# Patient Record
Sex: Female | Born: 1945 | Race: White | Hispanic: No | Marital: Married | Smoking: Never smoker
Health system: Southern US, Community
[De-identification: ages and names within clinical notes are randomized; demographics above are authoritative.]

## PROBLEM LIST (undated history)

## (undated) DIAGNOSIS — L509 Urticaria, unspecified: Secondary | ICD-10-CM

## (undated) DIAGNOSIS — O2240 Hemorrhoids in pregnancy, unspecified trimester: Secondary | ICD-10-CM

## (undated) DIAGNOSIS — D649 Anemia, unspecified: Secondary | ICD-10-CM

## (undated) HISTORY — PX: DILATION AND CURETTAGE OF UTERUS: SHX78

## (undated) HISTORY — PX: COLONOSCOPY: SHX174

---

## 2007-07-11 ENCOUNTER — Ambulatory Visit: Payer: Self-pay | Admitting: Gynecology

## 2007-07-11 ENCOUNTER — Encounter: Payer: Self-pay | Admitting: Obstetrics & Gynecology

## 2007-12-09 ENCOUNTER — Ambulatory Visit: Payer: Self-pay | Admitting: Gastroenterology

## 2009-01-01 ENCOUNTER — Ambulatory Visit: Payer: Self-pay | Admitting: Family Medicine

## 2010-01-02 ENCOUNTER — Ambulatory Visit: Payer: Self-pay | Admitting: Family Medicine

## 2011-01-28 ENCOUNTER — Ambulatory Visit: Payer: Self-pay | Admitting: Family Medicine

## 2011-07-08 ENCOUNTER — Emergency Department: Payer: Self-pay | Admitting: Internal Medicine

## 2012-02-09 ENCOUNTER — Ambulatory Visit: Payer: Self-pay | Admitting: Family Medicine

## 2013-03-15 ENCOUNTER — Ambulatory Visit: Payer: Self-pay | Admitting: Family Medicine

## 2014-03-02 ENCOUNTER — Emergency Department: Payer: Self-pay | Admitting: Emergency Medicine

## 2014-03-02 LAB — COMPREHENSIVE METABOLIC PANEL
ALT: 28 U/L (ref 12–78)
AST: 27 U/L (ref 15–37)
Albumin: 4 g/dL (ref 3.4–5.0)
Alkaline Phosphatase: 77 U/L
Anion Gap: 11 (ref 7–16)
BILIRUBIN TOTAL: 1.5 mg/dL — AB (ref 0.2–1.0)
BUN: 14 mg/dL (ref 7–18)
CO2: 22 mmol/L (ref 21–32)
CREATININE: 0.68 mg/dL (ref 0.60–1.30)
Calcium, Total: 9.4 mg/dL (ref 8.5–10.1)
Chloride: 109 mmol/L — ABNORMAL HIGH (ref 98–107)
EGFR (African American): >60
EGFR (Non-African Amer.): >60
GLUCOSE: 103 mg/dL — AB (ref 65–99)
OSMOLALITY: 284 (ref 275–301)
POTASSIUM: 3.9 mmol/L (ref 3.5–5.1)
Sodium: 142 mmol/L (ref 136–145)
Total Protein: 7.7 g/dL (ref 6.4–8.2)

## 2014-03-02 LAB — URINALYSIS, COMPLETE
Bacteria: NONE SEEN
Bilirubin,UR: NEGATIVE
Blood: NEGATIVE
GLUCOSE, UR: NEGATIVE mg/dL (ref 0–75)
Leukocyte Esterase: NEGATIVE
NITRITE: NEGATIVE
Ph: 5 (ref 4.5–8.0)
Protein: 30
RBC,UR: 1 /HPF (ref 0–5)
Specific Gravity: 1.025 (ref 1.003–1.030)
Squamous Epithelial: <1

## 2014-03-02 LAB — CBC WITH DIFFERENTIAL/PLATELET
BASOS PCT: 0.2 %
Basophil #: 0 10*3/uL (ref 0.0–0.1)
EOS ABS: 0 10*3/uL (ref 0.0–0.7)
Eosinophil %: 0.5 %
HCT: 44.9 % (ref 35.0–47.0)
HGB: 14.8 g/dL (ref 12.0–16.0)
LYMPHS PCT: 6.5 %
Lymphocyte #: 0.5 10*3/uL — ABNORMAL LOW (ref 1.0–3.6)
MCH: 30.3 pg (ref 26.0–34.0)
MCHC: 33 g/dL (ref 32.0–36.0)
MCV: 92 fL (ref 80–100)
MONO ABS: 0.5 x10 3/mm (ref 0.2–0.9)
MONOS PCT: 6.6 %
NEUTROS ABS: 6.5 10*3/uL (ref 1.4–6.5)
NEUTROS PCT: 86.2 %
PLATELETS: 170 10*3/uL (ref 150–440)
RBC: 4.89 10*6/uL (ref 3.80–5.20)
RDW: 12.9 % (ref 11.5–14.5)
WBC: 7.6 10*3/uL (ref 3.6–11.0)

## 2014-03-02 LAB — LIPASE, BLOOD: Lipase: 119 U/L (ref 73–393)

## 2014-03-02 LAB — TROPONIN I

## 2014-03-02 LAB — MAGNESIUM: Magnesium: 2 mg/dL

## 2014-04-06 ENCOUNTER — Ambulatory Visit: Payer: Self-pay | Admitting: Family Medicine

## 2015-10-16 ENCOUNTER — Other Ambulatory Visit: Payer: Self-pay | Admitting: Family Medicine

## 2015-10-16 DIAGNOSIS — Z1231 Encounter for screening mammogram for malignant neoplasm of breast: Secondary | ICD-10-CM

## 2015-10-31 ENCOUNTER — Ambulatory Visit
Admission: RE | Admit: 2015-10-31 | Discharge: 2015-10-31 | Disposition: A | Discharge status: Home / Self Care | Payer: Commercial Managed Care - HMO | Source: Ambulatory Visit | Attending: Family Medicine | Admitting: Family Medicine

## 2015-10-31 ENCOUNTER — Other Ambulatory Visit: Payer: Self-pay | Admitting: Family Medicine

## 2015-10-31 DIAGNOSIS — Z1231 Encounter for screening mammogram for malignant neoplasm of breast: Secondary | ICD-10-CM | POA: Diagnosis not present

## 2015-11-28 DIAGNOSIS — M545 Low back pain: Secondary | ICD-10-CM | POA: Diagnosis not present

## 2015-11-28 DIAGNOSIS — M47816 Spondylosis without myelopathy or radiculopathy, lumbar region: Secondary | ICD-10-CM | POA: Diagnosis not present

## 2015-12-03 ENCOUNTER — Other Ambulatory Visit: Payer: Self-pay | Admitting: Orthopedic Surgery

## 2015-12-03 DIAGNOSIS — M47816 Spondylosis without myelopathy or radiculopathy, lumbar region: Secondary | ICD-10-CM

## 2015-12-19 ENCOUNTER — Ambulatory Visit: Admission: RE | Admit: 2015-12-19 | Payer: PPO | Source: Ambulatory Visit

## 2015-12-25 DIAGNOSIS — M810 Age-related osteoporosis without current pathological fracture: Secondary | ICD-10-CM | POA: Diagnosis not present

## 2015-12-26 DIAGNOSIS — M25542 Pain in joints of left hand: Secondary | ICD-10-CM | POA: Diagnosis not present

## 2015-12-26 DIAGNOSIS — M720 Palmar fascial fibromatosis [Dupuytren]: Secondary | ICD-10-CM | POA: Diagnosis not present

## 2016-02-13 ENCOUNTER — Ambulatory Visit
Admission: RE | Admit: 2016-02-13 | Discharge: 2016-02-13 | Disposition: A | Discharge status: Home / Self Care | Payer: PPO | Source: Ambulatory Visit | Attending: Orthopedic Surgery | Admitting: Orthopedic Surgery

## 2016-02-13 DIAGNOSIS — M5126 Other intervertebral disc displacement, lumbar region: Secondary | ICD-10-CM | POA: Insufficient documentation

## 2016-02-13 DIAGNOSIS — M545 Low back pain: Secondary | ICD-10-CM | POA: Diagnosis not present

## 2016-02-13 DIAGNOSIS — M47816 Spondylosis without myelopathy or radiculopathy, lumbar region: Secondary | ICD-10-CM

## 2016-02-13 DIAGNOSIS — S3992XA Unspecified injury of lower back, initial encounter: Secondary | ICD-10-CM | POA: Diagnosis not present

## 2016-03-02 DIAGNOSIS — H2513 Age-related nuclear cataract, bilateral: Secondary | ICD-10-CM | POA: Diagnosis not present

## 2016-03-10 DIAGNOSIS — M5136 Other intervertebral disc degeneration, lumbar region: Secondary | ICD-10-CM | POA: Diagnosis not present

## 2016-03-10 DIAGNOSIS — M461 Sacroiliitis, not elsewhere classified: Secondary | ICD-10-CM | POA: Diagnosis not present

## 2016-03-10 DIAGNOSIS — M5416 Radiculopathy, lumbar region: Secondary | ICD-10-CM | POA: Diagnosis not present

## 2016-07-07 DIAGNOSIS — M76822 Posterior tibial tendinitis, left leg: Secondary | ICD-10-CM | POA: Diagnosis not present

## 2016-07-07 DIAGNOSIS — M25572 Pain in left ankle and joints of left foot: Secondary | ICD-10-CM | POA: Diagnosis not present

## 2016-07-07 DIAGNOSIS — M722 Plantar fascial fibromatosis: Secondary | ICD-10-CM | POA: Diagnosis not present

## 2016-07-28 DIAGNOSIS — M722 Plantar fascial fibromatosis: Secondary | ICD-10-CM | POA: Diagnosis not present

## 2016-07-28 DIAGNOSIS — M76822 Posterior tibial tendinitis, left leg: Secondary | ICD-10-CM | POA: Diagnosis not present

## 2016-11-04 DIAGNOSIS — Z0000 Encounter for general adult medical examination without abnormal findings: Secondary | ICD-10-CM | POA: Diagnosis not present

## 2016-11-11 DIAGNOSIS — M810 Age-related osteoporosis without current pathological fracture: Secondary | ICD-10-CM | POA: Diagnosis not present

## 2016-11-11 DIAGNOSIS — E785 Hyperlipidemia, unspecified: Secondary | ICD-10-CM | POA: Diagnosis not present

## 2016-11-11 DIAGNOSIS — Z0000 Encounter for general adult medical examination without abnormal findings: Secondary | ICD-10-CM | POA: Diagnosis not present

## 2016-11-25 ENCOUNTER — Other Ambulatory Visit: Payer: Self-pay | Admitting: Family Medicine

## 2016-11-25 DIAGNOSIS — Z1231 Encounter for screening mammogram for malignant neoplasm of breast: Secondary | ICD-10-CM

## 2016-12-22 ENCOUNTER — Ambulatory Visit
Admission: RE | Admit: 2016-12-22 | Discharge: 2016-12-22 | Disposition: A | Discharge status: Home / Self Care | Payer: PPO | Source: Ambulatory Visit | Attending: Family Medicine | Admitting: Family Medicine

## 2016-12-22 DIAGNOSIS — Z1231 Encounter for screening mammogram for malignant neoplasm of breast: Secondary | ICD-10-CM | POA: Insufficient documentation

## 2017-02-01 DIAGNOSIS — M791 Myalgia: Secondary | ICD-10-CM | POA: Diagnosis not present

## 2017-02-01 DIAGNOSIS — R509 Fever, unspecified: Secondary | ICD-10-CM | POA: Diagnosis not present

## 2017-02-01 DIAGNOSIS — J101 Influenza due to other identified influenza virus with other respiratory manifestations: Secondary | ICD-10-CM | POA: Diagnosis not present

## 2017-04-20 DIAGNOSIS — D692 Other nonthrombocytopenic purpura: Secondary | ICD-10-CM | POA: Diagnosis not present

## 2017-04-20 DIAGNOSIS — L570 Actinic keratosis: Secondary | ICD-10-CM | POA: Diagnosis not present

## 2017-04-20 DIAGNOSIS — L578 Other skin changes due to chronic exposure to nonionizing radiation: Secondary | ICD-10-CM | POA: Diagnosis not present

## 2017-04-20 DIAGNOSIS — L739 Follicular disorder, unspecified: Secondary | ICD-10-CM | POA: Diagnosis not present

## 2017-05-20 DIAGNOSIS — M79671 Pain in right foot: Secondary | ICD-10-CM | POA: Diagnosis not present

## 2017-05-20 DIAGNOSIS — M722 Plantar fascial fibromatosis: Secondary | ICD-10-CM | POA: Diagnosis not present

## 2017-08-10 DIAGNOSIS — M722 Plantar fascial fibromatosis: Secondary | ICD-10-CM | POA: Diagnosis not present

## 2017-11-09 DIAGNOSIS — Z0000 Encounter for general adult medical examination without abnormal findings: Secondary | ICD-10-CM | POA: Diagnosis not present

## 2017-11-16 DIAGNOSIS — Z0000 Encounter for general adult medical examination without abnormal findings: Secondary | ICD-10-CM | POA: Diagnosis not present

## 2017-11-16 DIAGNOSIS — Z1211 Encounter for screening for malignant neoplasm of colon: Secondary | ICD-10-CM | POA: Diagnosis not present

## 2017-11-30 ENCOUNTER — Other Ambulatory Visit: Payer: Self-pay | Admitting: Family Medicine

## 2017-11-30 DIAGNOSIS — Z1231 Encounter for screening mammogram for malignant neoplasm of breast: Secondary | ICD-10-CM

## 2017-12-09 DIAGNOSIS — H25813 Combined forms of age-related cataract, bilateral: Secondary | ICD-10-CM | POA: Diagnosis not present

## 2017-12-24 ENCOUNTER — Ambulatory Visit
Admission: RE | Admit: 2017-12-24 | Discharge: 2017-12-24 | Disposition: A | Discharge status: Home / Self Care | Payer: PPO | Source: Ambulatory Visit | Attending: Family Medicine | Admitting: Family Medicine

## 2017-12-24 DIAGNOSIS — Z1231 Encounter for screening mammogram for malignant neoplasm of breast: Secondary | ICD-10-CM

## 2018-01-10 DIAGNOSIS — Z1211 Encounter for screening for malignant neoplasm of colon: Secondary | ICD-10-CM | POA: Diagnosis not present

## 2018-02-10 DIAGNOSIS — N898 Other specified noninflammatory disorders of vagina: Secondary | ICD-10-CM | POA: Diagnosis not present

## 2018-02-10 DIAGNOSIS — R300 Dysuria: Secondary | ICD-10-CM | POA: Diagnosis not present

## 2018-02-10 DIAGNOSIS — N390 Urinary tract infection, site not specified: Secondary | ICD-10-CM | POA: Diagnosis not present

## 2018-03-01 DIAGNOSIS — M722 Plantar fascial fibromatosis: Secondary | ICD-10-CM | POA: Diagnosis not present

## 2018-03-04 DIAGNOSIS — N390 Urinary tract infection, site not specified: Secondary | ICD-10-CM | POA: Diagnosis not present

## 2018-03-04 DIAGNOSIS — R300 Dysuria: Secondary | ICD-10-CM | POA: Diagnosis not present

## 2018-03-29 ENCOUNTER — Encounter: Payer: Self-pay | Admitting: *Deleted

## 2018-03-30 ENCOUNTER — Encounter: Payer: Self-pay | Admitting: *Deleted

## 2018-03-30 ENCOUNTER — Other Ambulatory Visit: Payer: Self-pay

## 2018-03-30 ENCOUNTER — Ambulatory Visit: Payer: PPO | Admitting: Anesthesiology

## 2018-03-30 ENCOUNTER — Ambulatory Visit
Admission: RE | Admit: 2018-03-30 | Discharge: 2018-03-30 | Disposition: A | Discharge status: Home / Self Care | Payer: PPO | Source: Ambulatory Visit | Attending: Unknown Physician Specialty | Admitting: Unknown Physician Specialty

## 2018-03-30 ENCOUNTER — Encounter
Admission: RE | Disposition: A | Discharge status: Home / Self Care | Payer: Self-pay | Source: Ambulatory Visit | Attending: Unknown Physician Specialty

## 2018-03-30 DIAGNOSIS — Z1211 Encounter for screening for malignant neoplasm of colon: Secondary | ICD-10-CM | POA: Diagnosis not present

## 2018-03-30 DIAGNOSIS — C189 Malignant neoplasm of colon, unspecified: Secondary | ICD-10-CM | POA: Diagnosis not present

## 2018-03-30 DIAGNOSIS — K649 Unspecified hemorrhoids: Secondary | ICD-10-CM | POA: Diagnosis not present

## 2018-03-30 DIAGNOSIS — K5730 Diverticulosis of large intestine without perforation or abscess without bleeding: Secondary | ICD-10-CM | POA: Diagnosis not present

## 2018-03-30 DIAGNOSIS — K640 First degree hemorrhoids: Secondary | ICD-10-CM | POA: Diagnosis not present

## 2018-03-30 DIAGNOSIS — K5790 Diverticulosis of intestine, part unspecified, without perforation or abscess without bleeding: Secondary | ICD-10-CM | POA: Diagnosis not present

## 2018-03-30 DIAGNOSIS — Z79899 Other long term (current) drug therapy: Secondary | ICD-10-CM | POA: Insufficient documentation

## 2018-03-30 HISTORY — PX: COLONOSCOPY WITH PROPOFOL: SHX5780

## 2018-03-30 HISTORY — DX: Urticaria, unspecified: L50.9

## 2018-03-30 HISTORY — DX: Anemia, unspecified: D64.9

## 2018-03-30 HISTORY — DX: Hemorrhoids in pregnancy, unspecified trimester: O22.40

## 2018-03-30 SURGERY — COLONOSCOPY WITH PROPOFOL
Anesthesia: General

## 2018-03-30 MED ORDER — PROPOFOL 500 MG/50ML IV EMUL
INTRAVENOUS | Status: DC | PRN
  Administered 2018-03-30: 50 ug/kg/min via INTRAVENOUS

## 2018-03-30 MED ORDER — SODIUM CHLORIDE 0.9 % IV SOLN: INTRAVENOUS | Status: DC

## 2018-03-30 MED ORDER — SODIUM CHLORIDE 0.9 % IV SOLN
INTRAVENOUS | Status: DC
  Administered 2018-03-30 (×2): via INTRAVENOUS

## 2018-03-30 MED ORDER — PROPOFOL 10 MG/ML IV BOLUS
INTRAVENOUS | Status: DC | PRN
  Administered 2018-03-30: 20 mg via INTRAVENOUS

## 2018-03-30 NOTE — Anesthesia Preprocedure Evaluation (Signed)
Anesthesia Evaluation  Patient identified by MRN, date of birth, ID band Patient awake    Reviewed: Allergy & Precautions, H&P , NPO status , Patient's Chart, lab work & pertinent test results, reviewed documented beta blocker date and time   Airway Mallampati: II   Neck ROM: full    Dental  (+) Poor Dentition, Teeth Intact   Pulmonary neg pulmonary ROS,    Pulmonary exam normal        Cardiovascular negative cardio ROS Normal cardiovascular exam Rhythm:regular Rate:Normal     Neuro/Psych negative neurological ROS  negative psych ROS   GI/Hepatic negative GI ROS, Neg liver ROS,   Endo/Other  negative endocrine ROS  Renal/GU negative Renal ROS  negative genitourinary   Musculoskeletal   Abdominal   Peds  Hematology negative hematology ROS (+) anemia ,   Anesthesia Other Findings Past Medical History: No date: Anemia No date: Hemorrhoids during pregnancy No date: Hives Past Surgical History: No date: COLONOSCOPY No date: DILATION AND CURETTAGE OF UTERUS BMI    Body Mass Index:  27.44 kg/m     Reproductive/Obstetrics negative OB ROS                             Anesthesia Physical Anesthesia Plan  ASA: II  Anesthesia Plan: General   Post-op Pain Management:    Induction:   PONV Risk Score and Plan:   Airway Management Planned:   Additional Equipment:   Intra-op Plan:   Post-operative Plan:   Informed Consent: I have reviewed the patients History and Physical, chart, labs and discussed the procedure including the risks, benefits and alternatives for the proposed anesthesia with the patient or authorized representative who has indicated his/her understanding and acceptance.   Dental Advisory Given  Plan Discussed with: CRNA  Anesthesia Plan Comments:         Anesthesia Quick Evaluation

## 2018-03-30 NOTE — Anesthesia Postprocedure Evaluation (Signed)
Anesthesia Post Note  Patient: Martha [name]  Procedure(s) Performed: COLONOSCOPY WITH PROPOFOL (N/A )  Patient location during evaluation: PACU Anesthesia Type: General Level of consciousness: awake and alert Pain management: pain level controlled Vital Signs Assessment: post-procedure vital signs reviewed and stable Respiratory status: spontaneous breathing, nonlabored ventilation, respiratory function stable and patient connected to nasal cannula oxygen Cardiovascular status: blood pressure returned to baseline and stable Postop Assessment: no apparent nausea or vomiting Anesthetic complications: no     Last Vitals:  Vitals:   03/30/18 0940 03/30/18 1016  BP: 108/64   Pulse: 78 70  Resp: 20 (!) 30  Temp: (!) 36.1 C   SpO2: 99% 98%    Last Pain:  Vitals:   03/30/18 1016  TempSrc:   PainSc: 0-No pain                 Molli Barrows

## 2018-03-30 NOTE — Op Note (Signed)
[facility] Gastroenterology Patient Name: Martha [name] Procedure Date: 03/30/2018 9:14 AM MRN: [id number] Account #: [id number] Date of Birth: July 25, 1946 Admit Type: Outpatient Age: 72 Room: [facility] ENDO ROOM 3 Gender: Female Note Status: Finalized Procedure:            Colonoscopy Indications:          Screening for colorectal malignant neoplasm Providers:            Manya Silvas, MD Referring MD:         Irven Easterly. Kary Kos, MD (Referring MD) Medicines:            Propofol per Anesthesia Complications:        No immediate complications. Procedure:            Pre-Anesthesia Assessment:                       - After reviewing the risks and benefits, the patient                        was deemed in satisfactory condition to undergo the                        procedure.                       After obtaining informed consent, the colonoscope was                        passed under direct vision. Throughout the procedure,                        the patient's blood pressure, pulse, and oxygen                        saturations were monitored continuously. The                        Colonoscope was introduced through the anus and                        advanced to the the cecum, identified by appendiceal                        orifice and ileocecal valve. The colonoscopy was                        performed without difficulty. The patient tolerated the                        procedure well. The quality of the bowel preparation                        was excellent. Findings:      A few small-mouthed diverticula were found in the sigmoid colon.      Internal hemorrhoids were found during endoscopy. The hemorrhoids were       small, medium-sized and Grade I (internal hemorrhoids that do not       prolapse).      The exam was otherwise without abnormality. Impression:           - Diverticulosis in the sigmoid colon.                       -  No specimens  collected. Recommendation:       - Repeat colonoscopy in 10 years for screening                        purposes. If in good health. Manya Silvas, MD 03/30/2018 9:42:20 AM This report has been signed electronically. Number of Addenda: 0 Note Initiated On: 03/30/2018 9:14 AM Scope Withdrawal Time: 0 hours 8 minutes 13 seconds  Total Procedure Duration: 0 hours 15 minutes 28 seconds       [facility]

## 2018-03-30 NOTE — Transfer of Care (Signed)
Immediate Anesthesia Transfer of Care Note  Patient: Quetzal P Pozo  Procedure(s) Performed: COLONOSCOPY WITH PROPOFOL (N/A )  Patient Location: PACU  Anesthesia Type:General  Level of Consciousness: awake, alert  and oriented  Airway & Oxygen Therapy: Patient Spontanous Breathing  Post-op Assessment: Report given to RN  Post vital signs: Reviewed and stable  Last Vitals:  Vitals Value Taken Time  BP    Temp    Pulse    Resp    SpO2      Last Pain:  Vitals:   03/30/18 0814  TempSrc: Tympanic  PainSc: 0-No pain         Complications: No apparent anesthesia complications

## 2018-03-30 NOTE — Anesthesia Post-op Follow-up Note (Signed)
Anesthesia QCDR form completed.        

## 2018-03-30 NOTE — H&P (Signed)
Primary Care Physician:  [place] Pink, MD Primary Gastroenterologist:  Dr. Vira Agar  Pre-Procedure History & Physical: HPI:  Martha [name] is a 72 y.o. female is here for an colonoscopy.  Done for screening.   Past Medical History:  Diagnosis Date  . Anemia   . Hemorrhoids during pregnancy   . Hives     Past Surgical History:  Procedure Laterality Date  . COLONOSCOPY    . DILATION AND CURETTAGE OF UTERUS      Prior to Admission medications   Medication Sig Start Date End Date Taking? Authorizing Provider  furosemide (LASIX) 20 MG tablet Take 20 mg by mouth daily as needed.   Yes [provider]  lovastatin (MEVACOR) 40 MG tablet Take 40 mg by mouth at bedtime.   Yes [provider]    Allergies as of 02/22/2018  . (Not on File)    Family History  Problem Relation Age of Onset  . Breast cancer Neg Hx     Social History   Socioeconomic History  . Marital status: Married    Spouse name: Not on file  . Number of children: Not on file  . Years of education: Not on file  . Highest education level: Not on file  Occupational History  . Not on file  Social Needs  . Financial resource strain: Not on file  . Food insecurity:    Worry: Not on file    Inability: Not on file  . Transportation needs:    Medical: Not on file    Non-medical: Not on file  Tobacco Use  . Smoking status: Never Smoker  . Smokeless tobacco: Never Used  Substance and Sexual Activity  . Alcohol use: Yes    Alcohol/week: 3.6 oz    Types: 6 Cans of beer per week  . Drug use: Never  . Sexual activity: Not on file  Lifestyle  . Physical activity:    Days per week: Not on file    Minutes per session: Not on file  . Stress: Not on file  Relationships  . Social connections:    Talks on phone: Not on file    Gets together: Not on file    Attends religious service: Not on file    Active member of club or organization: Not on file    Attends meetings of clubs or  organizations: Not on file    Relationship status: Not on file  . Intimate partner violence:    Fear of current or ex partner: Not on file    Emotionally abused: Not on file    Physically abused: Not on file    Forced sexual activity: Not on file  Other Topics Concern  . Not on file  Social History Narrative  . Not on file    Review of Systems: See HPI, otherwise negative ROS  Physical Exam: BP 108/86   Pulse 84   Temp (!) 96.6 F (35.9 C) (Tympanic)   Resp 18   Ht 5\' 2"  (1.575 m)   Wt 68 kg (150 lb)   SpO2 98%   BMI 27.44 kg/m  General:   Alert,  pleasant and cooperative in NAD Head:  Normocephalic and atraumatic. Neck:  Supple; no masses or thyromegaly. Lungs:  Clear throughout to auscultation.    Heart:  Regular rate and rhythm. Abdomen:  Soft, nontender and nondistended. Normal bowel sounds, without guarding, and without rebound.   Neurologic:  Alert and  oriented x4;  grossly normal neurologically.  Impression/Plan: Martha [name] is here for an colonoscopy to be performed for screening colonoscopy  Risks, benefits, limitations, and alternatives regarding  colonoscopy have been reviewed with the patient.  Questions have been answered.  All parties agreeable.   Gaylyn Cheers, MD  03/30/2018, 9:07 AM

## 2018-03-31 ENCOUNTER — Encounter: Payer: Self-pay | Admitting: Unknown Physician Specialty

## 2018-08-16 DIAGNOSIS — Z23 Encounter for immunization: Secondary | ICD-10-CM | POA: Diagnosis not present

## 2018-11-03 IMAGING — MG MM DIGITAL SCREENING BILAT W/ TOMO W/ CAD
8 of 12 series · 8 of 28 positions shown · non-contrast
Comparison: Previous exam(s).

CLINICAL DATA: Screening.

EXAM:
DIGITAL SCREENING BILATERAL MAMMOGRAM WITH TOMO AND CAD

[L CC synth-2D]
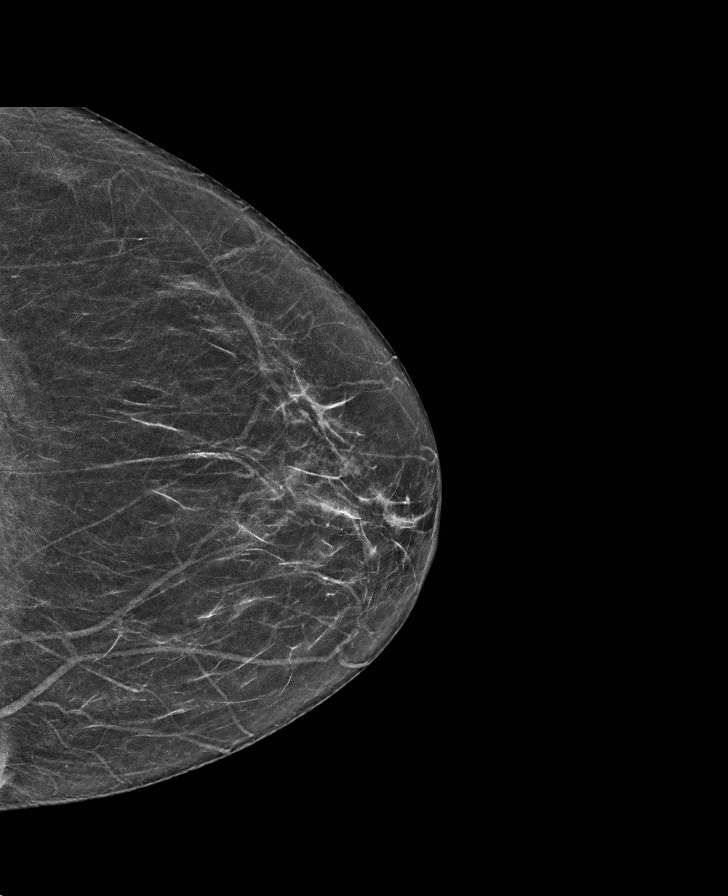

[L MLO synth-2D]
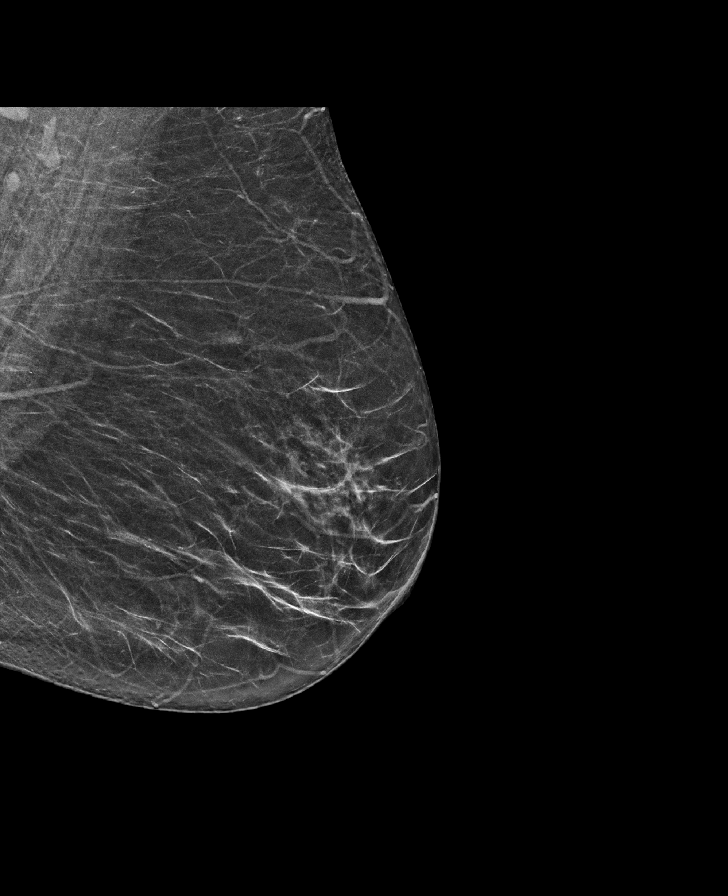

[L MLO]
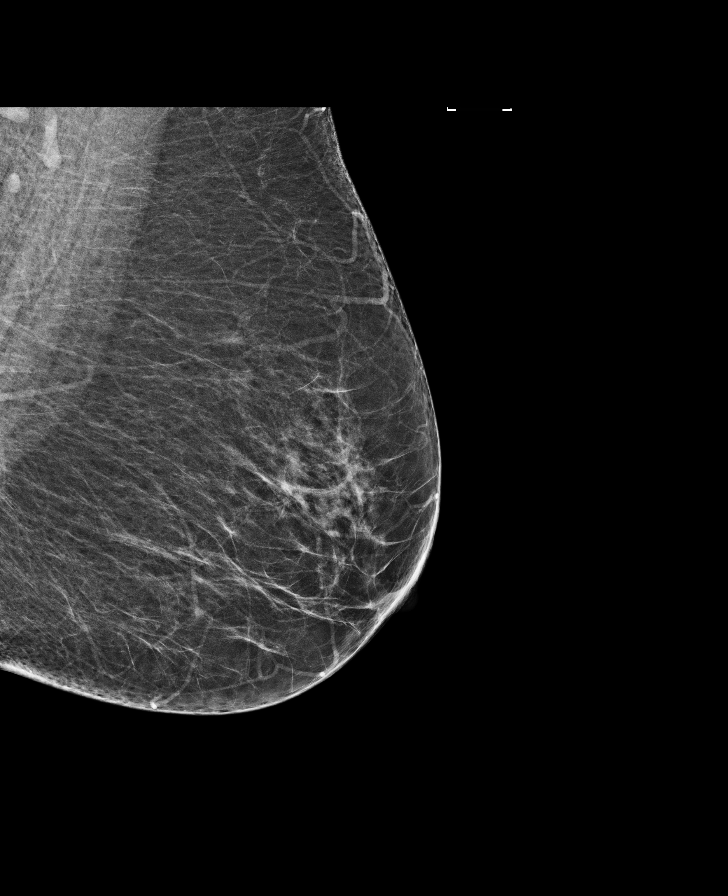

[R CC synth-2D]
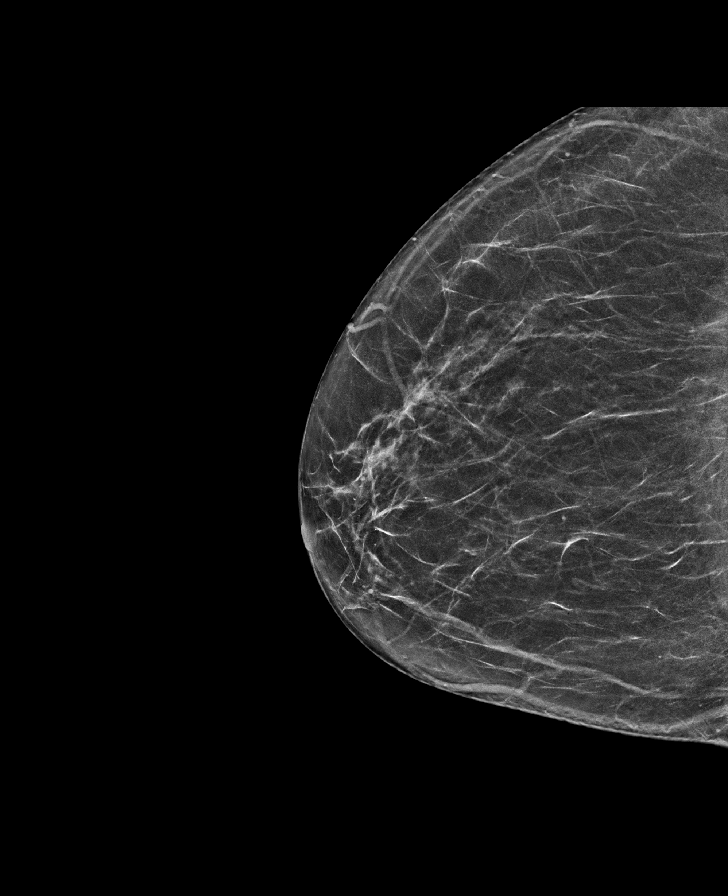

[L CC]
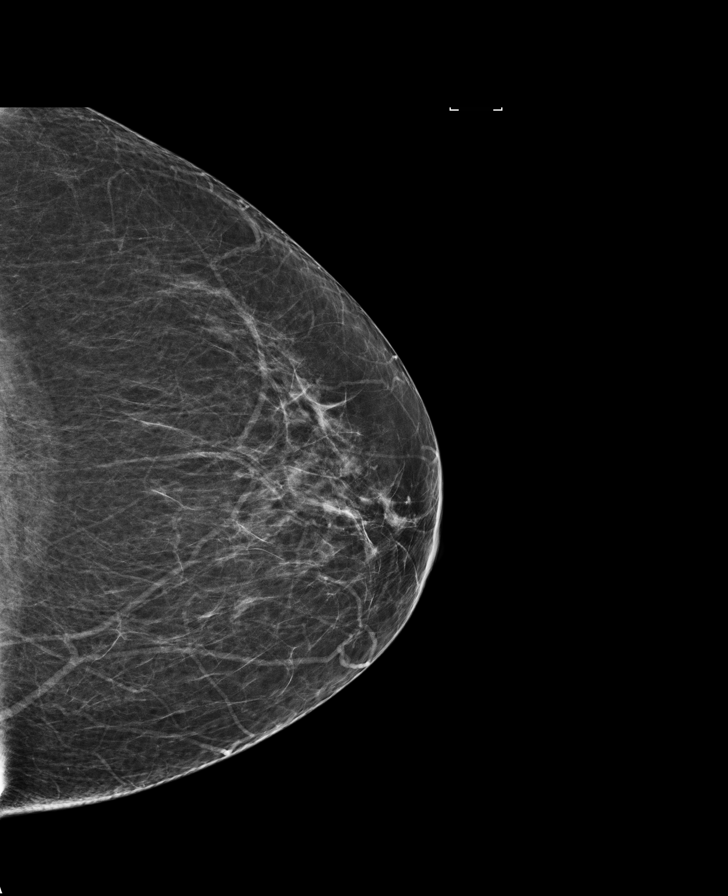

[R MLO synth-2D]
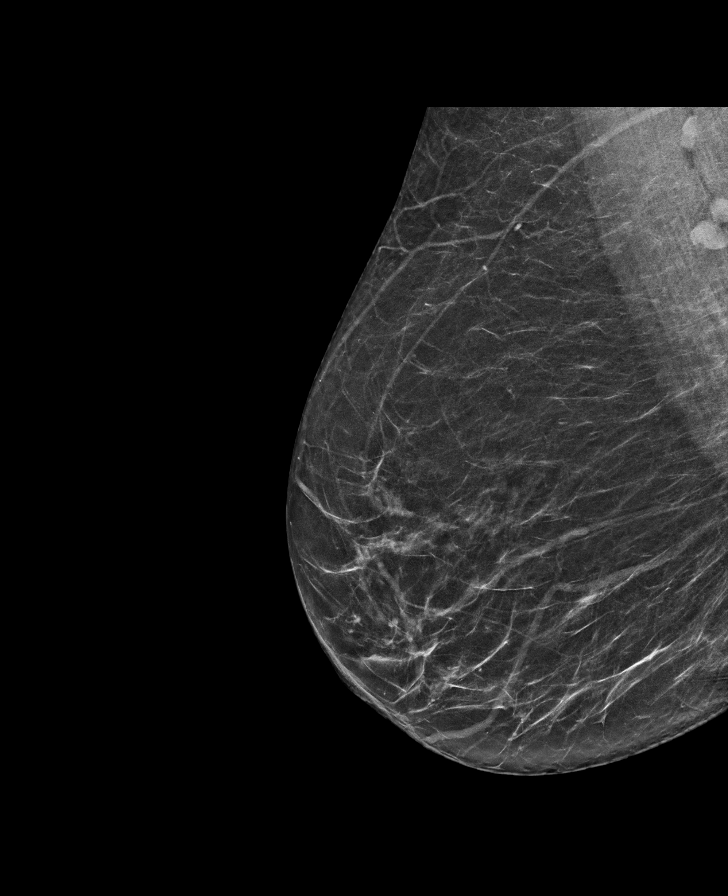

[R MLO]
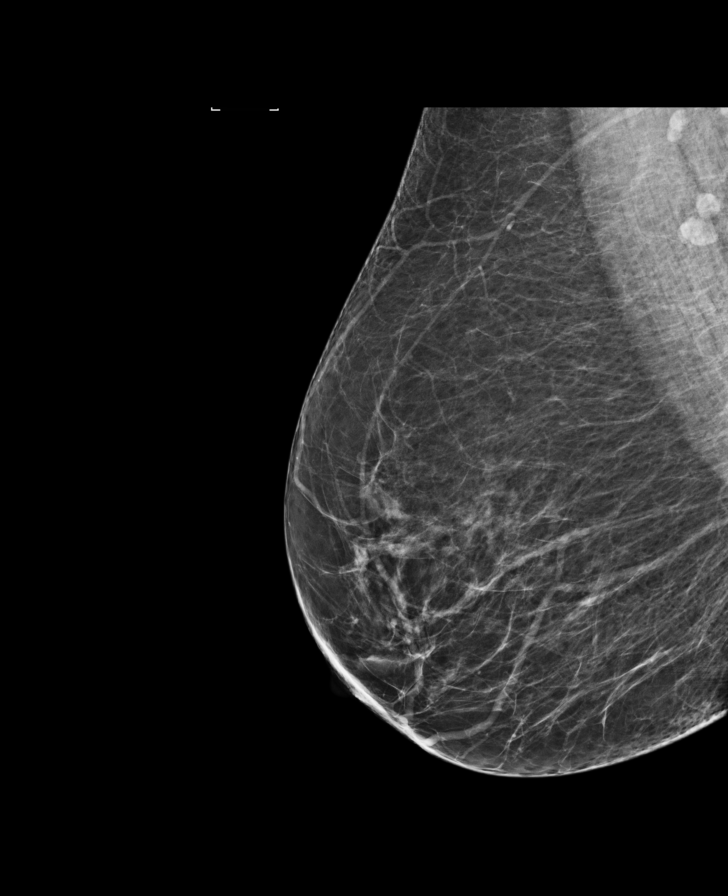

[R CC]
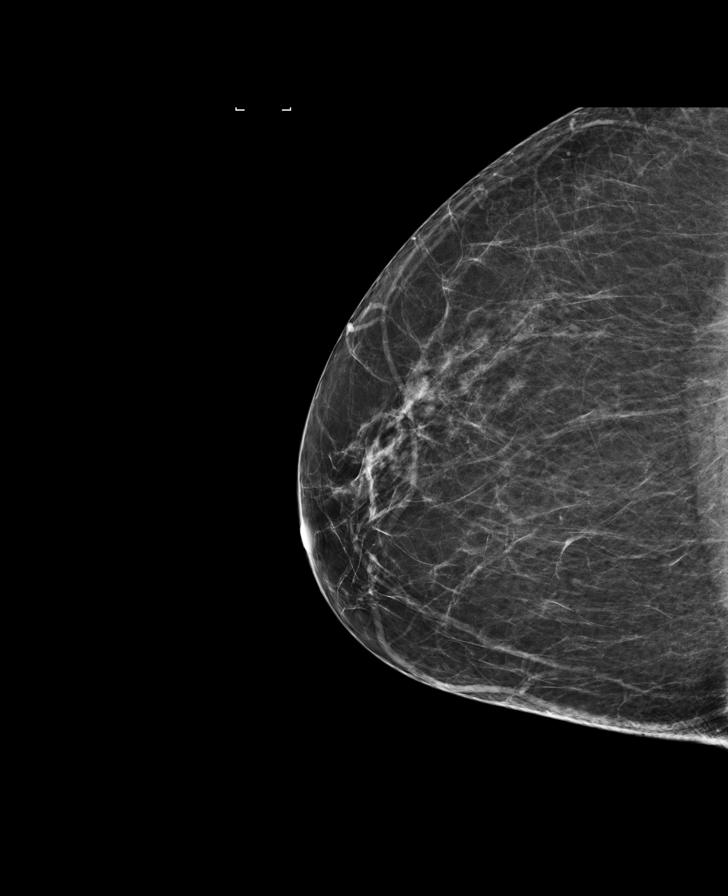

[8 of 28 positions shown; findings below may reference images not displayed]

ACR Breast Density Category b: There are scattered areas of
fibroglandular density.
FINDINGS: There are no findings suspicious for malignancy. Images were
processed with CAD.
IMPRESSION: No mammographic evidence of malignancy. A result letter of this
screening mammogram will be mailed directly to the patient.

RECOMMENDATION:
Screening mammogram in one year. (Code:[id number])

BI-RADS CATEGORY  1: Negative.

## 2018-11-10 DIAGNOSIS — Z0000 Encounter for general adult medical examination without abnormal findings: Secondary | ICD-10-CM | POA: Diagnosis not present

## 2018-11-10 DIAGNOSIS — E785 Hyperlipidemia, unspecified: Secondary | ICD-10-CM | POA: Diagnosis not present

## 2018-11-17 DIAGNOSIS — Z0000 Encounter for general adult medical examination without abnormal findings: Secondary | ICD-10-CM | POA: Diagnosis not present

## 2018-11-17 DIAGNOSIS — E785 Hyperlipidemia, unspecified: Secondary | ICD-10-CM | POA: Diagnosis not present

## 2018-11-18 ENCOUNTER — Other Ambulatory Visit: Payer: Self-pay | Admitting: Family Medicine

## 2018-11-18 DIAGNOSIS — Z1231 Encounter for screening mammogram for malignant neoplasm of breast: Secondary | ICD-10-CM

## 2018-12-26 ENCOUNTER — Ambulatory Visit
Admission: RE | Admit: 2018-12-26 | Discharge: 2018-12-26 | Disposition: A | Discharge status: Home / Self Care | Payer: PPO | Source: Ambulatory Visit | Attending: Family Medicine | Admitting: Family Medicine

## 2018-12-26 DIAGNOSIS — Z1231 Encounter for screening mammogram for malignant neoplasm of breast: Secondary | ICD-10-CM | POA: Diagnosis not present

## 2019-08-04 DIAGNOSIS — Z23 Encounter for immunization: Secondary | ICD-10-CM | POA: Diagnosis not present

## 2019-12-05 ENCOUNTER — Other Ambulatory Visit: Payer: Self-pay | Admitting: Family Medicine

## 2019-12-05 DIAGNOSIS — Z1231 Encounter for screening mammogram for malignant neoplasm of breast: Secondary | ICD-10-CM

## 2019-12-11 ENCOUNTER — Ambulatory Visit: Payer: PPO | Attending: Internal Medicine

## 2020-01-01 ENCOUNTER — Ambulatory Visit
Admission: RE | Admit: 2020-01-01 | Discharge: 2020-01-01 | Disposition: A | Discharge status: Home / Self Care | Payer: PPO | Source: Ambulatory Visit | Attending: Family Medicine | Admitting: Family Medicine

## 2020-01-01 DIAGNOSIS — Z1231 Encounter for screening mammogram for malignant neoplasm of breast: Secondary | ICD-10-CM | POA: Diagnosis not present

## 2020-03-14 DIAGNOSIS — E785 Hyperlipidemia, unspecified: Secondary | ICD-10-CM | POA: Diagnosis not present

## 2020-03-21 DIAGNOSIS — Z0000 Encounter for general adult medical examination without abnormal findings: Secondary | ICD-10-CM | POA: Diagnosis not present

## 2020-03-21 DIAGNOSIS — E785 Hyperlipidemia, unspecified: Secondary | ICD-10-CM | POA: Diagnosis not present

## 2020-05-31 DIAGNOSIS — M7541 Impingement syndrome of right shoulder: Secondary | ICD-10-CM | POA: Diagnosis not present

## 2020-05-31 DIAGNOSIS — M79644 Pain in right finger(s): Secondary | ICD-10-CM | POA: Diagnosis not present

## 2020-05-31 DIAGNOSIS — G8929 Other chronic pain: Secondary | ICD-10-CM | POA: Diagnosis not present

## 2020-05-31 DIAGNOSIS — M7551 Bursitis of right shoulder: Secondary | ICD-10-CM | POA: Diagnosis not present

## 2020-05-31 DIAGNOSIS — M25511 Pain in right shoulder: Secondary | ICD-10-CM | POA: Diagnosis not present

## 2020-05-31 DIAGNOSIS — M65331 Trigger finger, right middle finger: Secondary | ICD-10-CM | POA: Diagnosis not present

## 2020-08-02 ENCOUNTER — Ambulatory Visit: Payer: PPO | Attending: Internal Medicine

## 2020-08-02 ENCOUNTER — Other Ambulatory Visit: Payer: Self-pay | Admitting: Internal Medicine

## 2020-08-02 DIAGNOSIS — Z23 Encounter for immunization: Secondary | ICD-10-CM

## 2020-08-02 NOTE — Progress Notes (Signed)
   Covid-19 Vaccination Clinic  Name:  Martha [name]    MRN: [id number] DOB: October 18, 1946  08/02/2020  Martha [name] was observed post Covid-19 immunization for 15 minutes without incident. She was provided with Vaccine Information Sheet and instruction to access the V-Safe system.   Martha [name] was instructed to call 911 with any severe reactions post vaccine: Marland Kitchen Difficulty breathing  . Swelling of face and throat  . A fast heartbeat  . A bad rash all over body  . Dizziness and weakness

## 2020-09-02 DIAGNOSIS — M65331 Trigger finger, right middle finger: Secondary | ICD-10-CM | POA: Diagnosis not present

## 2020-09-02 DIAGNOSIS — M79644 Pain in right finger(s): Secondary | ICD-10-CM | POA: Diagnosis not present

## 2020-09-06 DIAGNOSIS — Z23 Encounter for immunization: Secondary | ICD-10-CM | POA: Diagnosis not present

## 2020-11-07 DIAGNOSIS — H9191 Unspecified hearing loss, right ear: Secondary | ICD-10-CM | POA: Diagnosis not present

## 2020-12-04 DIAGNOSIS — H6501 Acute serous otitis media, right ear: Secondary | ICD-10-CM | POA: Diagnosis not present

## 2020-12-04 DIAGNOSIS — H9191 Unspecified hearing loss, right ear: Secondary | ICD-10-CM | POA: Diagnosis not present

## 2020-12-25 ENCOUNTER — Other Ambulatory Visit: Payer: Self-pay | Admitting: Family Medicine

## 2020-12-25 DIAGNOSIS — Z1231 Encounter for screening mammogram for malignant neoplasm of breast: Secondary | ICD-10-CM

## 2021-01-07 ENCOUNTER — Other Ambulatory Visit: Payer: Self-pay

## 2021-01-07 ENCOUNTER — Ambulatory Visit
Admission: RE | Admit: 2021-01-07 | Discharge: 2021-01-07 | Disposition: A | Discharge status: Home / Self Care | Payer: PPO | Source: Ambulatory Visit | Attending: Family Medicine | Admitting: Family Medicine

## 2021-01-07 DIAGNOSIS — Z1231 Encounter for screening mammogram for malignant neoplasm of breast: Secondary | ICD-10-CM | POA: Insufficient documentation

## 2021-01-08 DIAGNOSIS — H6981 Other specified disorders of Eustachian tube, right ear: Secondary | ICD-10-CM | POA: Diagnosis not present

## 2021-01-30 DIAGNOSIS — H6981 Other specified disorders of Eustachian tube, right ear: Secondary | ICD-10-CM | POA: Diagnosis not present

## 2021-01-30 DIAGNOSIS — J302 Other seasonal allergic rhinitis: Secondary | ICD-10-CM | POA: Diagnosis not present

## 2021-03-18 DIAGNOSIS — Z0000 Encounter for general adult medical examination without abnormal findings: Secondary | ICD-10-CM | POA: Diagnosis not present

## 2021-03-18 DIAGNOSIS — E785 Hyperlipidemia, unspecified: Secondary | ICD-10-CM | POA: Diagnosis not present

## 2021-03-25 DIAGNOSIS — E785 Hyperlipidemia, unspecified: Secondary | ICD-10-CM | POA: Diagnosis not present

## 2021-03-25 DIAGNOSIS — D7589 Other specified diseases of blood and blood-forming organs: Secondary | ICD-10-CM | POA: Diagnosis not present

## 2021-03-25 DIAGNOSIS — H9191 Unspecified hearing loss, right ear: Secondary | ICD-10-CM | POA: Diagnosis not present

## 2021-03-25 DIAGNOSIS — Z0000 Encounter for general adult medical examination without abnormal findings: Secondary | ICD-10-CM | POA: Diagnosis not present

## 2021-08-08 DIAGNOSIS — Z23 Encounter for immunization: Secondary | ICD-10-CM | POA: Diagnosis not present

## 2021-08-18 DIAGNOSIS — H6981 Other specified disorders of Eustachian tube, right ear: Secondary | ICD-10-CM | POA: Diagnosis not present

## 2021-08-18 DIAGNOSIS — H6061 Unspecified chronic otitis externa, right ear: Secondary | ICD-10-CM | POA: Diagnosis not present

## 2021-10-21 DIAGNOSIS — M549 Dorsalgia, unspecified: Secondary | ICD-10-CM | POA: Diagnosis not present

## 2021-11-06 ENCOUNTER — Other Ambulatory Visit: Payer: Self-pay

## 2021-11-06 ENCOUNTER — Emergency Department: Payer: PPO

## 2021-11-06 ENCOUNTER — Emergency Department
Admission: EM | Admit: 2021-11-06 | Discharge: 2021-11-06 | Disposition: A | Discharge status: Home / Self Care | Payer: PPO | Attending: Emergency Medicine | Admitting: Emergency Medicine

## 2021-11-06 DIAGNOSIS — R079 Chest pain, unspecified: Secondary | ICD-10-CM | POA: Insufficient documentation

## 2021-11-06 DIAGNOSIS — R0789 Other chest pain: Secondary | ICD-10-CM | POA: Diagnosis not present

## 2021-11-06 LAB — BASIC METABOLIC PANEL
Anion gap: 6 (ref 5–15)
BUN: 14 mg/dL (ref 8–23)
CO2: 27 mmol/L (ref 22–32)
Calcium: 8.9 mg/dL (ref 8.9–10.3)
Chloride: 110 mmol/L (ref 98–111)
Creatinine, Ser: 0.58 mg/dL (ref 0.44–1.00)
GFR, Estimated: >60 mL/min (ref >60)
Glucose, Bld: 107 mg/dL — ABNORMAL HIGH (ref 70–99)
Potassium: 3.8 mmol/L (ref 3.5–5.1)
Sodium: 143 mmol/L (ref 135–145)

## 2021-11-06 LAB — CBC
HCT: 25.1 % — ABNORMAL LOW (ref 36.0–46.0)
Hemoglobin: 9.1 g/dL — ABNORMAL LOW (ref 12.0–15.0)
MCH: 43.5 pg — ABNORMAL HIGH (ref 26.0–34.0)
MCHC: 36.3 g/dL — ABNORMAL HIGH (ref 30.0–36.0)
MCV: 120.1 fL — ABNORMAL HIGH (ref 80.0–100.0)
Platelets: 123 10*3/uL — ABNORMAL LOW (ref 150–400)
RBC: 2.09 MIL/uL — ABNORMAL LOW (ref 3.87–5.11)
RDW: 13.4 % (ref 11.5–15.5)
WBC: 2.3 10*3/uL — ABNORMAL LOW (ref 4.0–10.5)
nRBC: 0 % (ref 0.0–0.2)

## 2021-11-06 LAB — TROPONIN I (HIGH SENSITIVITY)
Troponin I (High Sensitivity): <2 ng/L (ref <18)
Troponin I (High Sensitivity): <2 ng/L (ref <18)

## 2021-11-06 NOTE — ED Provider Notes (Signed)
[facility] Provider Note    Event Date/Time   First MD Initiated Contact with Patient 11/06/21 1739     (approximate)   History   Chest Pain   HPI  Martha [name] is a 76 y.o. female  who,  according to clinic office visit dated 12.20.22 has history of anemia and HLD, presents to the emergency department today because of concern for chest pain. Located in her central chest. States it feels like a "nerve" type pain. Has noticed it on and off over the past 1.5 weeks. She has not noticed any pattern to it.  The pain occurred again this morning.  She states that only will last for couple of seconds.  She will feel some shortness of breath when it occurs.  Denies any nausea or vomiting.  At the time my exam patient denies any current discomfort.    Physical Exam   Triage Vital Signs: ED Triage Vitals  Enc Vitals Group     BP 11/06/21 1227 111/72     Pulse Rate 11/06/21 1227 82     Resp 11/06/21 1227 17     Temp 11/06/21 1227 98.4 F (36.9 C)     Temp Source 11/06/21 1810 Oral     SpO2 11/06/21 1227 100 %     Weight 11/06/21 1230 145 lb (65.8 kg)     Height 11/06/21 1230 5\' 3"  (1.6 m)     Head Circumference --      Peak Flow --      Pain Score 11/06/21 1229 7   Most recent vital signs: Vitals:   11/06/21 1227 11/06/21 1810  BP: 111/72 (!) 144/82  Pulse: 82 80  Resp: 17 18  Temp: 98.4 F (36.9 C) (!) 97.5 F (36.4 C)  SpO2: 100% 100%    General: Awake, no distress.  CV:  Good peripheral perfusion.  Resp:  Normal effort.  Abd:  No distention.     ED Results / Procedures / Treatments   Labs (all labs ordered are listed, but only abnormal results are displayed) Labs Reviewed  BASIC METABOLIC PANEL - Abnormal; Notable for the following components:      Result Value   Glucose, Bld 107 (*)    All other components within normal limits  CBC - Abnormal; Notable for the following components:   WBC 2.3 (*)    RBC 2.09 (*)    Hemoglobin 9.1  (*)    HCT 25.1 (*)    MCV 120.1 (*)    MCH 43.5 (*)    MCHC 36.3 (*)    Platelets 123 (*)    All other components within normal limits  TROPONIN I (HIGH SENSITIVITY)  TROPONIN I (HIGH SENSITIVITY)     EKG  I, Nance Pear, attending physician, personally viewed and interpreted this EKG  EKG Time: 1238 Rate: 84 Rhythm: normal sinus rhythm Axis: normal Intervals: qtc 404 QRS: narrow ST changes: no st elevation Impression: abnormal ekg   RADIOLOGY CXR My interpretation: No pna, no ptx. Radiologist interpretation: IMPRESSION:  No acute cardiopulmonary process.      PROCEDURES:  Critical Care performed: No  Procedures   MEDICATIONS ORDERED IN ED: Medications - No data to display   IMPRESSION / MDM / [place] / ED COURSE  I reviewed the triage vital signs and the nursing notes.  Differential diagnosis includes, but is not limited to, ACS, pneumonia, pneumothorax, PE, dissection.  Patient presented to the emergency department today because of concerns for intermittent chest pain that is been going on for the past week and a half.  Pain-free at the time my examination.  Work-up here with troponin negative x2, I think ACS unlikely.  Additionally chest x-ray without any pneumothorax or pneumonia.  Given intermittent nature of pain I do think dissection or PE would also be unlikely.  EKG without concerning arrhythmia.  Blood work did show some anemia.  I discussed this with the patient.  She does have history of anemia so I do think follow-up with primary care is appropriate.  At this time given reassuring work-up I do not feel patient requires inpatient admission.  FINAL CLINICAL IMPRESSION(S) / ED DIAGNOSES   Final diagnoses:  Nonspecific chest pain      Note:  This document was prepared using Dragon voice recognition software and may include unintentional dictation errors.    Nance Pear, MD 11/06/21 [phone]

## 2021-11-06 NOTE — ED Triage Notes (Signed)
Pt to ER via POV with complaints of centralized non-radiating chest pain and shortness of breath, described as "gasping for air" that has been ongoing this week.   Denies fever/chills/ NVD. No history of the same.

## 2021-11-06 NOTE — Discharge Instructions (Signed)
As we discussed it is very important that you follow up with Dr. Chrystine Oiler. Please seek medical attention for any high fevers, chest pain, shortness of breath, change in behavior, persistent vomiting, bloody stool or any other new or concerning symptoms.

## 2021-11-06 NOTE — ED Provider Triage Note (Signed)
Emergency Medicine Provider Triage Evaluation Note  Martha [name] , a 76 y.o. female  was evaluated in triage.  Pt complains of anterior chest pain and short of breath, no radiation of pain.  Has been going on for 1 1/2 weeks.  No prior cardiac history.   Review of Systems  Positive: Chest pain Negative: No N/V/D, no fever or chills  Physical Exam  There were no vitals taken for this visit. Gen:   Awake, no distress  able to answer question without difficulty Resp:  Normal effort   Lungs Clear bilaterally.  Heart RRR MSK:   Moves extremities without difficulty No edema lower extremities Other:    Medical Decision Making  Medically screening exam initiated at 12:27 PM.  Appropriate orders placed.  Martha [name] was informed that the remainder of the evaluation will be completed by another provider, this initial triage assessment does not replace that evaluation, and the importance of remaining in the ED until their evaluation is complete.     Martha Hai, PA-C 11/06/21 1235

## 2021-11-17 DIAGNOSIS — R5383 Other fatigue: Secondary | ICD-10-CM | POA: Diagnosis not present

## 2021-11-17 DIAGNOSIS — D539 Nutritional anemia, unspecified: Secondary | ICD-10-CM | POA: Diagnosis not present

## 2021-11-25 ENCOUNTER — Other Ambulatory Visit: Payer: Self-pay

## 2021-11-25 ENCOUNTER — Inpatient Hospital Stay: Payer: PPO

## 2021-11-25 ENCOUNTER — Encounter: Payer: Self-pay | Admitting: Internal Medicine

## 2021-11-25 ENCOUNTER — Inpatient Hospital Stay: Payer: PPO | Attending: Internal Medicine | Admitting: Internal Medicine

## 2021-11-25 DIAGNOSIS — D510 Vitamin B12 deficiency anemia due to intrinsic factor deficiency: Secondary | ICD-10-CM | POA: Insufficient documentation

## 2021-11-25 DIAGNOSIS — E538 Deficiency of other specified B group vitamins: Secondary | ICD-10-CM | POA: Insufficient documentation

## 2021-11-25 DIAGNOSIS — D649 Anemia, unspecified: Secondary | ICD-10-CM

## 2021-11-25 DIAGNOSIS — O2240 Hemorrhoids in pregnancy, unspecified trimester: Secondary | ICD-10-CM | POA: Insufficient documentation

## 2021-11-25 DIAGNOSIS — Z79899 Other long term (current) drug therapy: Secondary | ICD-10-CM | POA: Diagnosis not present

## 2021-11-25 DIAGNOSIS — L509 Urticaria, unspecified: Secondary | ICD-10-CM | POA: Insufficient documentation

## 2021-11-25 DIAGNOSIS — E785 Hyperlipidemia, unspecified: Secondary | ICD-10-CM | POA: Insufficient documentation

## 2021-11-25 LAB — COMPREHENSIVE METABOLIC PANEL
ALT: 19 U/L (ref 0–44)
AST: 25 U/L (ref 15–41)
Albumin: 4.1 g/dL (ref 3.5–5.0)
Alkaline Phosphatase: 51 U/L (ref 38–126)
Anion gap: 7 (ref 5–15)
BUN: 19 mg/dL (ref 8–23)
CO2: 23 mmol/L (ref 22–32)
Calcium: 8.8 mg/dL — ABNORMAL LOW (ref 8.9–10.3)
Chloride: 108 mmol/L (ref 98–111)
Creatinine, Ser: 0.67 mg/dL (ref 0.44–1.00)
GFR, Estimated: >60 mL/min (ref >60)
Glucose, Bld: 98 mg/dL (ref 70–99)
Potassium: 3.9 mmol/L (ref 3.5–5.1)
Sodium: 138 mmol/L (ref 135–145)
Total Bilirubin: 2.8 mg/dL — ABNORMAL HIGH (ref 0.3–1.2)
Total Protein: 6 g/dL — ABNORMAL LOW (ref 6.5–8.1)

## 2021-11-25 LAB — CBC WITH DIFFERENTIAL/PLATELET
Abs Immature Granulocytes: 0.01 10*3/uL (ref 0.00–0.07)
Basophils Absolute: 0 10*3/uL (ref 0.0–0.1)
Basophils Relative: 1 %
Eosinophils Absolute: 0.1 10*3/uL (ref 0.0–0.5)
Eosinophils Relative: 2 %
HCT: 22.9 % — ABNORMAL LOW (ref 36.0–46.0)
Hemoglobin: 8.4 g/dL — ABNORMAL LOW (ref 12.0–15.0)
Immature Granulocytes: 1 %
Lymphocytes Relative: 31 %
Lymphs Abs: 0.7 10*3/uL (ref 0.7–4.0)
MCH: 44 pg — ABNORMAL HIGH (ref 26.0–34.0)
MCHC: 36.7 g/dL — ABNORMAL HIGH (ref 30.0–36.0)
MCV: 119.9 fL — ABNORMAL HIGH (ref 80.0–100.0)
Monocytes Absolute: 0 10*3/uL — ABNORMAL LOW (ref 0.1–1.0)
Monocytes Relative: 2 %
Neutro Abs: 1.4 10*3/uL — ABNORMAL LOW (ref 1.7–7.7)
Neutrophils Relative %: 63 %
Platelets: 98 10*3/uL — ABNORMAL LOW (ref 150–400)
RBC: 1.91 MIL/uL — ABNORMAL LOW (ref 3.87–5.11)
RDW: 14.5 % (ref 11.5–15.5)
WBC: 2.2 10*3/uL — ABNORMAL LOW (ref 4.0–10.5)
nRBC: 0 % (ref 0.0–0.2)

## 2021-11-25 LAB — FOLATE: Folate: 30 ng/mL (ref >5.9)

## 2021-11-25 LAB — LACTATE DEHYDROGENASE: LDH: 428 U/L — ABNORMAL HIGH (ref 98–192)

## 2021-11-25 LAB — RETICULOCYTES
Immature Retic Fract: 15.3 % (ref 2.3–15.9)
RBC.: 1.93 MIL/uL — ABNORMAL LOW (ref 3.87–5.11)
Retic Count, Absolute: 38.6 10*3/uL (ref 19.0–186.0)
Retic Ct Pct: 2 % (ref 0.4–3.1)

## 2021-11-25 MED ORDER — CYANOCOBALAMIN 1000 MCG/ML IJ SOLN
1000.0000 ug | Freq: Once | INTRAMUSCULAR | Status: AC
  Administered 2021-11-25: 12:00:00 1000 ug via INTRAMUSCULAR
  Filled 2021-11-25: qty 1

## 2021-11-25 NOTE — Progress Notes (Signed)
Patient here for initial oncology appointment,  concerns of SOB, fatigue, chest discomfort

## 2021-11-25 NOTE — Progress Notes (Signed)
Martha [name]  Patient Care Team: [place] Pink, MD as PCP - General (Family Medicine)   Cancer Staging  No matching staging information was found for the patient.   Oncology History   No history exists.   # Moderate anemia JAN 2023- hemoglobin 9; WBC 2.3; platelets 123-MCV 120.  B12 low at <  50. [PCP]   HISTORY OF PRESENT ILLNESS: Ambulating independently.  Accompanied by her husband.  Martha [name] 76 y.o.  female pleasant patient above history of with no significant medical comorbidities has been referred to [place] for further evaluation recommendations regarding anemia/fatigue.  Patient admits to worsening shortness of breath/severe fatigue progressive getting worse over the last 1 to 2 months.  Complains of shortness of breath on exertion.  Denies any tingling or numbness.  Positive weight loss.  Poor appetite.  Complains of episodes of dizziness.  No passing out spells.  Of [name] patient was evaluated in the emergency room approximately 2 weeks ago for similar complaints.  Denies any blood in stools or black or stools.  Denies nausea vomiting.  Denies any abdominal pain.   Review of Systems  Constitutional:  Positive for malaise/fatigue and weight loss. Negative for chills, diaphoresis and fever.  HENT:  Negative for nosebleeds and sore throat.   Eyes:  Negative for double vision.  Respiratory:  Positive for shortness of breath. Negative for cough, hemoptysis, sputum production and wheezing.   Cardiovascular:  Negative for chest pain, palpitations, orthopnea and leg swelling.  Gastrointestinal:  Negative for abdominal pain, blood in stool, constipation, diarrhea, heartburn, melena, nausea and vomiting.  Genitourinary:  Negative for dysuria, frequency and urgency.  Musculoskeletal:  Negative for back pain and joint pain.  Skin: Negative.  Negative for itching and rash.  Neurological:  Positive for dizziness. Negative for tingling, focal weakness,  weakness and headaches.  Endo/Heme/Allergies:  Does not bruise/bleed easily.  Psychiatric/Behavioral:  Negative for depression. The patient is not nervous/anxious and does not have insomnia.      PAST MEDICAL HISTORY :  Past Medical History:  Diagnosis Date   Anemia    Hemorrhoids during pregnancy    Hives     PAST SURGICAL HISTORY :   Past Surgical History:  Procedure Laterality Date   COLONOSCOPY     COLONOSCOPY WITH PROPOFOL N/A 03/30/2018   Procedure: COLONOSCOPY WITH PROPOFOL;  Surgeon: Manya Silvas, MD;  Location: [facility] ENDOSCOPY;  Service: Endoscopy;  Laterality: N/A;   DILATION AND CURETTAGE OF UTERUS      FAMILY HISTORY :   Family History  Problem Relation Age of Onset   Prostate cancer Father    Heart disease Father    Breast cancer Neg Hx     SOCIAL HISTORY:   Social History   Tobacco Use   Smoking status: Never   Smokeless tobacco: Never  Vaping Use   Vaping Use: Never used  Substance Use Topics   Alcohol use: Not Currently    Alcohol/week: 6.0 standard drinks    Types: 6 Cans of beer per week   Drug use: Never    ALLERGIES:  has no allergies on file.  MEDICATIONS:  Current Outpatient Medications  Medication Sig Dispense Refill   furosemide (LASIX) 20 MG tablet Take 20 mg by mouth daily as needed.     lovastatin (MEVACOR) 40 MG tablet Take 40 mg by mouth at bedtime.     mometasone (ELOCON) 0.1 % lotion SMARTSIG:2-4 Drop(s) In Ear(s) Every Night PRN  No current facility-administered medications for this visit.   Facility-Administered Medications Ordered in Other Visits  Medication Dose Route Frequency Provider Last Rate Last Admin   cyanocobalamin ((VITAMIN B-12)) injection 1,000 mcg  1,000 mcg Intramuscular Once Cammie Sickle, MD        PHYSICAL EXAMINATION: ECOG PERFORMANCE STATUS: 2 - Symptomatic, <50% confined to bed  BP 100/65 (BP Location: Left Arm, Patient Position: Sitting)    Pulse 98    Temp (!) 97 F (36.1 C)  (Tympanic)    Resp 18    Wt 133 lb (60.3 kg)    SpO2 100%    BMI 23.56 kg/m   Filed Weights   11/25/21 1124  Weight: 133 lb (60.3 kg)    Physical Exam Vitals and nursing [name] reviewed.  HENT:     Head: Normocephalic and atraumatic.     Mouth/Throat:     Pharynx: Oropharynx is clear.  Eyes:     Extraocular Movements: Extraocular movements intact.     Pupils: Pupils are equal, round, and reactive to light.  Cardiovascular:     Rate and Rhythm: Normal rate and regular rhythm.  Pulmonary:     Comments: Decreased breath sounds bilaterally.  Abdominal:     Palpations: Abdomen is soft.  Musculoskeletal:        General: Normal range of motion.     Cervical back: Normal range of motion.  Skin:    General: Skin is warm.  Neurological:     General: No focal deficit present.     Mental Status: She is alert and oriented to person, place, and time.  Psychiatric:        Behavior: Behavior normal.        Judgment: Judgment normal.       LABORATORY DATA:  I have reviewed the data as listed    Component Value Date/Time   NA 143 11/06/2021 1232   NA 142 03/02/2014 0810   K 3.8 11/06/2021 1232   K 3.9 03/02/2014 0810   CL 110 11/06/2021 1232   CL 109 (H) 03/02/2014 0810   CO2 27 11/06/2021 1232   CO2 22 03/02/2014 0810   GLUCOSE 107 (H) 11/06/2021 1232   GLUCOSE 103 (H) 03/02/2014 0810   BUN 14 11/06/2021 1232   BUN 14 03/02/2014 0810   CREATININE 0.58 11/06/2021 1232   CREATININE 0.68 03/02/2014 0810   CALCIUM 8.9 11/06/2021 1232   CALCIUM 9.4 03/02/2014 0810   PROT 7.7 03/02/2014 0810   ALBUMIN 4.0 03/02/2014 0810   AST 27 03/02/2014 0810   ALT 28 03/02/2014 0810   ALKPHOS 77 03/02/2014 0810   BILITOT 1.5 (H) 03/02/2014 0810   GFRNONAA >60 11/06/2021 1232   GFRNONAA >60 03/02/2014 0810   GFRAA >60 03/02/2014 0810    No results found for: SPEP, UPEP  Lab Results  Component Value Date   WBC 2.3 (L) 11/06/2021   NEUTROABS 6.5 03/02/2014   HGB 9.1 (L) 11/06/2021    HCT 25.1 (L) 11/06/2021   MCV 120.1 (H) 11/06/2021   PLT 123 (L) 11/06/2021      Chemistry      Component Value Date/Time   NA 143 11/06/2021 1232   NA 142 03/02/2014 0810   K 3.8 11/06/2021 1232   K 3.9 03/02/2014 0810   CL 110 11/06/2021 1232   CL 109 (H) 03/02/2014 0810   CO2 27 11/06/2021 1232   CO2 22 03/02/2014 0810   BUN 14 11/06/2021 1232   BUN 14  03/02/2014 0810   CREATININE 0.58 11/06/2021 1232   CREATININE 0.68 03/02/2014 0810      Component Value Date/Time   CALCIUM 8.9 11/06/2021 1232   CALCIUM 9.4 03/02/2014 0810   ALKPHOS 77 03/02/2014 0810   AST 27 03/02/2014 0810   ALT 28 03/02/2014 0810   BILITOT 1.5 (H) 03/02/2014 0810       RADIOGRAPHIC STUDIES: I have personally reviewed the radiological images as listed and agreed with the findings in the report. No results found.   ASSESSMENT & PLAN:  Symptomatic anemia #Moderate anemia hemoglobin 9; WBC 2.3; platelets 123-MCV 120.  B12 low at <  50 [JAN 2023; PCP].  Patient is extremely symptomatic with worsening fatigue/shortness of breath dizzy spells.  #Pancytopenia can well be explained by patient's low B12 deficiency/pernicious anemia.  Recommend B12 injections daily for 7 days; and then once a week x4; and then once a month lifetime.   # ETIOLOGY: The etiology of severe B12 deficiency is unclear; question pernicious anemia versus others.  Check antiparietal antibodies intrinsic factor antibodies; methylmalonic acid LDH.  Also check peripheral smear.  Discussed with the patient and family that if patient's hematologic parameters do not improve on B12-would recommend further evaluation with a bone marrow biopsy to rule out MDS/acute leukemias.  Patient also will need EGD-if confirmed pernicious anemia.  #Weight loss-await above work-up; consider CT scan of the chest and pelvis/discussed next visit.  Thank you Dr. Kary Kos for allowing me to participate in the care of your pleasant patient. Please do not  hesitate to contact me with questions or concerns in the interim.  # DISPOSITION: # labs today- ordered today # b12 shot today [after the labs are drawn]; and then daily B12 injections x 6  # follow up in 1 week-MD; labs- cbc/bmp- B12 daily-Dr.B   Orders Placed This Encounter  Procedures   CBC with Differential/Platelet    Standing Status:   Future    Standing Expiration Date:   11/25/2022   Lactate dehydrogenase    Standing Status:   Future    Standing Expiration Date:   11/25/2022   Comprehensive metabolic panel    Standing Status:   Future    Standing Expiration Date:   11/25/2022   Pathologist smear review    Standing Status:   Future    Standing Expiration Date:   11/25/2022   Reticulocytes    Standing Status:   Future    Standing Expiration Date:   11/25/2022   Haptoglobin    Standing Status:   Future    Standing Expiration Date:   11/25/2022   Folate    Standing Status:   Future    Standing Expiration Date:   11/25/2022   Methylmalonic acid, serum    Standing Status:   Future    Standing Expiration Date:   11/25/2022   Anti-parietal antibody    Standing Status:   Future    Standing Expiration Date:   11/25/2022   Intrinsic Factor Antibodies    Standing Status:   Future    Standing Expiration Date:   11/25/2022   CBC with Differential/Platelet    Standing Status:   Future    Standing Expiration Date:   11/25/2022   Comprehensive metabolic panel    Standing Status:   Future    Standing Expiration Date:   11/25/2022   All questions were answered. The patient knows to call the clinic with any problems, questions or concerns.      Lenetta Quaker  Ann Lions, MD 11/25/2021 12:09 PM

## 2021-11-25 NOTE — Assessment & Plan Note (Addendum)
#  Moderate anemia hemoglobin 9; WBC 2.3; platelets 123-MCV 120.  B12 low at <  50 [JAN 2023; PCP].  Patient is extremely symptomatic with worsening fatigue/shortness of breath dizzy spells.  #Pancytopenia can well be explained by patient's low B12 deficiency/pernicious anemia.  Recommend B12 injections daily for 7 days; and then once a week x4; and then once a month lifetime.   # ETIOLOGY: The etiology of severe B12 deficiency is unclear; question pernicious anemia versus others.  Check antiparietal antibodies intrinsic factor antibodies; methylmalonic acid LDH.  Also check peripheral smear.  Discussed with the patient and family that if patient's hematologic parameters do not improve on B12-would recommend further evaluation with a bone marrow biopsy to rule out MDS/acute leukemias.  Patient also will need EGD-if confirmed pernicious anemia.  #Weight loss-await above work-up; consider CT scan of the chest and pelvis/discussed next visit.  Thank you Dr. Kary Kos for allowing me to participate in the care of your pleasant patient. Please do not hesitate to contact me with questions or concerns in the interim.  # DISPOSITION: # labs today- ordered today # b12 shot today [after the labs are drawn]; and then daily B12 injections x 6  # follow up in 1 week-MD; labs- cbc/bmp- B12 daily-Dr.B

## 2021-11-26 ENCOUNTER — Inpatient Hospital Stay: Payer: PPO

## 2021-11-26 DIAGNOSIS — D510 Vitamin B12 deficiency anemia due to intrinsic factor deficiency: Secondary | ICD-10-CM | POA: Diagnosis not present

## 2021-11-26 DIAGNOSIS — D649 Anemia, unspecified: Secondary | ICD-10-CM

## 2021-11-26 DIAGNOSIS — E538 Deficiency of other specified B group vitamins: Secondary | ICD-10-CM

## 2021-11-26 LAB — INTRINSIC FACTOR ANTIBODIES: Intrinsic Factor: 45.2 AU/mL — ABNORMAL HIGH (ref 0.0–1.1)

## 2021-11-26 LAB — PATHOLOGIST SMEAR REVIEW

## 2021-11-26 LAB — ANTI-PARIETAL ANTIBODY: Parietal Cell Antibody-IgG: 59.6 Units — ABNORMAL HIGH (ref 0.0–20.0)

## 2021-11-26 LAB — HAPTOGLOBIN: Haptoglobin: <10 mg/dL — ABNORMAL LOW (ref 42–346)

## 2021-11-26 MED ORDER — CYANOCOBALAMIN 1000 MCG/ML IJ SOLN
1000.0000 ug | Freq: Once | INTRAMUSCULAR | Status: AC
  Administered 2021-11-26: 12:00:00 1000 ug via INTRAMUSCULAR
  Filled 2021-11-26: qty 1

## 2021-11-27 ENCOUNTER — Inpatient Hospital Stay: Payer: PPO

## 2021-11-27 ENCOUNTER — Other Ambulatory Visit: Payer: Self-pay

## 2021-11-27 DIAGNOSIS — E538 Deficiency of other specified B group vitamins: Secondary | ICD-10-CM

## 2021-11-27 DIAGNOSIS — D510 Vitamin B12 deficiency anemia due to intrinsic factor deficiency: Secondary | ICD-10-CM | POA: Diagnosis not present

## 2021-11-27 DIAGNOSIS — D649 Anemia, unspecified: Secondary | ICD-10-CM

## 2021-11-27 MED ORDER — CYANOCOBALAMIN 1000 MCG/ML IJ SOLN
1000.0000 ug | Freq: Once | INTRAMUSCULAR | Status: AC
  Administered 2021-11-27: 1000 ug via INTRAMUSCULAR
  Filled 2021-11-27: qty 1

## 2021-11-28 ENCOUNTER — Inpatient Hospital Stay: Payer: PPO

## 2021-11-28 DIAGNOSIS — E538 Deficiency of other specified B group vitamins: Secondary | ICD-10-CM

## 2021-11-28 DIAGNOSIS — D649 Anemia, unspecified: Secondary | ICD-10-CM

## 2021-11-28 DIAGNOSIS — D510 Vitamin B12 deficiency anemia due to intrinsic factor deficiency: Secondary | ICD-10-CM | POA: Diagnosis not present

## 2021-11-28 MED ORDER — CYANOCOBALAMIN 1000 MCG/ML IJ SOLN
1000.0000 ug | Freq: Once | INTRAMUSCULAR | Status: AC
  Administered 2021-11-28: 1000 ug via INTRAMUSCULAR
  Filled 2021-11-28: qty 1

## 2021-11-30 LAB — METHYLMALONIC ACID, SERUM: Methylmalonic Acid, Quantitative: 16721 nmol/L — ABNORMAL HIGH (ref 0–378)

## 2021-12-01 ENCOUNTER — Inpatient Hospital Stay: Payer: PPO

## 2021-12-01 ENCOUNTER — Other Ambulatory Visit: Payer: Self-pay

## 2021-12-01 DIAGNOSIS — D510 Vitamin B12 deficiency anemia due to intrinsic factor deficiency: Secondary | ICD-10-CM | POA: Diagnosis not present

## 2021-12-01 DIAGNOSIS — D649 Anemia, unspecified: Secondary | ICD-10-CM

## 2021-12-01 DIAGNOSIS — E538 Deficiency of other specified B group vitamins: Secondary | ICD-10-CM

## 2021-12-01 MED ORDER — CYANOCOBALAMIN 1000 MCG/ML IJ SOLN
1000.0000 ug | Freq: Once | INTRAMUSCULAR | Status: AC
  Administered 2021-12-01: 1000 ug via INTRAMUSCULAR
  Filled 2021-12-01: qty 1

## 2021-12-02 ENCOUNTER — Encounter: Payer: Self-pay | Admitting: Internal Medicine

## 2021-12-02 ENCOUNTER — Telehealth: Payer: Self-pay

## 2021-12-02 ENCOUNTER — Inpatient Hospital Stay: Payer: PPO

## 2021-12-02 ENCOUNTER — Inpatient Hospital Stay: Payer: PPO | Admitting: Internal Medicine

## 2021-12-02 DIAGNOSIS — D510 Vitamin B12 deficiency anemia due to intrinsic factor deficiency: Secondary | ICD-10-CM | POA: Diagnosis not present

## 2021-12-02 DIAGNOSIS — E538 Deficiency of other specified B group vitamins: Secondary | ICD-10-CM

## 2021-12-02 DIAGNOSIS — D649 Anemia, unspecified: Secondary | ICD-10-CM

## 2021-12-02 LAB — CBC WITH DIFFERENTIAL/PLATELET
Abs Immature Granulocytes: 0.02 10*3/uL (ref 0.00–0.07)
Basophils Absolute: 0 10*3/uL (ref 0.0–0.1)
Basophils Relative: 0 %
Eosinophils Absolute: 0.1 10*3/uL (ref 0.0–0.5)
Eosinophils Relative: 2 %
HCT: 26 % — ABNORMAL LOW (ref 36.0–46.0)
Hemoglobin: 8.9 g/dL — ABNORMAL LOW (ref 12.0–15.0)
Immature Granulocytes: 1 %
Lymphocytes Relative: 32 %
Lymphs Abs: 1.2 10*3/uL (ref 0.7–4.0)
MCH: 39.9 pg — ABNORMAL HIGH (ref 26.0–34.0)
MCHC: 34.2 g/dL (ref 30.0–36.0)
MCV: 116.6 fL — ABNORMAL HIGH (ref 80.0–100.0)
Monocytes Absolute: 0.5 10*3/uL (ref 0.1–1.0)
Monocytes Relative: 12 %
Neutro Abs: 2 10*3/uL (ref 1.7–7.7)
Neutrophils Relative %: 53 %
Platelets: 193 10*3/uL (ref 150–400)
RBC: 2.23 MIL/uL — ABNORMAL LOW (ref 3.87–5.11)
RDW: 15.3 % (ref 11.5–15.5)
WBC: 3.7 10*3/uL — ABNORMAL LOW (ref 4.0–10.5)
nRBC: 0 % (ref 0.0–0.2)

## 2021-12-02 LAB — COMPREHENSIVE METABOLIC PANEL
ALT: 17 U/L (ref 0–44)
AST: 23 U/L (ref 15–41)
Albumin: 3.9 g/dL (ref 3.5–5.0)
Alkaline Phosphatase: 50 U/L (ref 38–126)
Anion gap: 5 (ref 5–15)
BUN: 15 mg/dL (ref 8–23)
CO2: 24 mmol/L (ref 22–32)
Calcium: 9 mg/dL (ref 8.9–10.3)
Chloride: 110 mmol/L (ref 98–111)
Creatinine, Ser: 0.56 mg/dL (ref 0.44–1.00)
GFR, Estimated: >60 mL/min (ref >60)
Glucose, Bld: 101 mg/dL — ABNORMAL HIGH (ref 70–99)
Potassium: 4 mmol/L (ref 3.5–5.1)
Sodium: 139 mmol/L (ref 135–145)
Total Bilirubin: 1.8 mg/dL — ABNORMAL HIGH (ref 0.3–1.2)
Total Protein: 6 g/dL — ABNORMAL LOW (ref 6.5–8.1)

## 2021-12-02 MED ORDER — CYANOCOBALAMIN 1000 MCG/ML IJ SOLN
1000.0000 ug | Freq: Once | INTRAMUSCULAR | Status: AC
  Administered 2021-12-02: 1000 ug via INTRAMUSCULAR
  Filled 2021-12-02: qty 1

## 2021-12-02 NOTE — Telephone Encounter (Signed)
CALLED PATIENT NO ANSWER LEFT VOICEMAIL FOR A CALL BACK To schedule in person visit

## 2021-12-02 NOTE — Progress Notes (Signed)
Pt states she has had some chest pain and SOB at times for the past 1.5 months.

## 2021-12-02 NOTE — Progress Notes (Signed)
[place] OFFICE PROGRESS NOTE  Patient Care Team: Maryland Pink, MD as PCP - General (Family Medicine)   Cancer Staging  No matching staging information was found for the patient.   Oncology History   No history exists.   #PERNICIOUS anemia-severe B12 deficiency/moderate anemia hemoglobin 9; WBC 2.3; platelets 123-MCV 120.  B12 low at <  50 [JAN 2023; PCP].  Positive for antiparietal antibodies/intrinsic factor antibody; elevated methylmalonic acid; high LDH/low haptoglobin.  B12 injections-   HISTORY OF PRESENT ILLNESS: Ambulating independently.  Alone.   Martha [name] 76 y.o.  female pleasant patient above history macrocytic anemia/seated B12 deficiency is here for follow-up.  Patient started B12 injections last week on a daily basis.   Patient noted to have mild improvement of her energy levels.  However continues to be short of breath especially exertion.  No tingling or numbness.  Review of Systems  Constitutional:  Positive for malaise/fatigue and weight loss. Negative for chills, diaphoresis and fever.  HENT:  Negative for nosebleeds and sore throat.   Eyes:  Negative for double vision.  Respiratory:  Positive for shortness of breath. Negative for cough, hemoptysis, sputum production and wheezing.   Cardiovascular:  Negative for chest pain, palpitations, orthopnea and leg swelling.  Gastrointestinal:  Negative for abdominal pain, blood in stool, constipation, diarrhea, heartburn, melena, nausea and vomiting.  Genitourinary:  Negative for dysuria, frequency and urgency.  Musculoskeletal:  Negative for back pain and joint pain.  Skin: Negative.  Negative for itching and rash.  Neurological:  Positive for dizziness. Negative for tingling, focal weakness, weakness and headaches.  Endo/Heme/Allergies:  Does not bruise/bleed easily.  Psychiatric/Behavioral:  Negative for depression. The patient is not nervous/anxious and does not have insomnia.      PAST  MEDICAL HISTORY :  Past Medical History:  Diagnosis Date   Anemia    Hemorrhoids during pregnancy    Hives     PAST SURGICAL HISTORY :   Past Surgical History:  Procedure Laterality Date   COLONOSCOPY     COLONOSCOPY WITH PROPOFOL N/A 03/30/2018   Procedure: COLONOSCOPY WITH PROPOFOL;  Surgeon: Manya Silvas, MD;  Location: [facility] ENDOSCOPY;  Service: Endoscopy;  Laterality: N/A;   DILATION AND CURETTAGE OF UTERUS      FAMILY HISTORY :   Family History  Problem Relation Age of Onset   Prostate cancer Father    Heart disease Father    Breast cancer Neg Hx     SOCIAL HISTORY:   Social History   Tobacco Use   Smoking status: Never   Smokeless tobacco: Never  Vaping Use   Vaping Use: Never used  Substance Use Topics   Alcohol use: Not Currently    Alcohol/week: 6.0 standard drinks    Types: 6 Cans of beer per week   Drug use: Never    ALLERGIES:  has no allergies on file.  MEDICATIONS:  Current Outpatient Medications  Medication Sig Dispense Refill   lovastatin (MEVACOR) 40 MG tablet Take 40 mg by mouth at bedtime.     furosemide (LASIX) 20 MG tablet Take 20 mg by mouth daily as needed. (Patient not taking: Reported on 12/02/2021)     mometasone (ELOCON) 0.1 % lotion SMARTSIG:2-4 Drop(s) In Ear(s) Every Night PRN (Patient not taking: Reported on 12/02/2021)     No current facility-administered medications for this visit.    PHYSICAL EXAMINATION: ECOG PERFORMANCE STATUS: 2 - Symptomatic, <50% confined to bed  BP 110/70 (BP Location: Left  Arm, Patient Position: Sitting, Cuff Size: Small)    Pulse 77    Temp 97.9 F (36.6 C) (Tympanic)    Ht 5\' 3"  (1.6 m)    Wt 134 lb 12.8 oz (61.1 kg)    SpO2 100%    BMI 23.88 kg/m   Filed Weights   12/02/21 0827  Weight: 134 lb 12.8 oz (61.1 kg)    Physical Exam Vitals and nursing note reviewed.  HENT:     Head: Normocephalic and atraumatic.     Mouth/Throat:     Pharynx: Oropharynx is clear.  Eyes:     Extraocular  Movements: Extraocular movements intact.     Pupils: Pupils are equal, round, and reactive to light.  Cardiovascular:     Rate and Rhythm: Normal rate and regular rhythm.  Pulmonary:     Comments: Decreased breath sounds bilaterally.  Abdominal:     Palpations: Abdomen is soft.  Musculoskeletal:        General: Normal range of motion.     Cervical back: Normal range of motion.  Skin:    General: Skin is warm.  Neurological:     General: No focal deficit present.     Mental Status: She is alert and oriented to person, place, and time.  Psychiatric:        Behavior: Behavior normal.        Judgment: Judgment normal.       LABORATORY DATA:  I have reviewed the data as listed    Component Value Date/Time   NA 139 12/02/2021 0812   NA 142 03/02/2014 0810   K 4.0 12/02/2021 0812   K 3.9 03/02/2014 0810   CL 110 12/02/2021 0812   CL 109 (H) 03/02/2014 0810   CO2 24 12/02/2021 0812   CO2 22 03/02/2014 0810   GLUCOSE 101 (H) 12/02/2021 0812   GLUCOSE 103 (H) 03/02/2014 0810   BUN 15 12/02/2021 0812   BUN 14 03/02/2014 0810   CREATININE 0.56 12/02/2021 0812   CREATININE 0.68 03/02/2014 0810   CALCIUM 9.0 12/02/2021 0812   CALCIUM 9.4 03/02/2014 0810   PROT 6.0 (L) 12/02/2021 0812   PROT 7.7 03/02/2014 0810   ALBUMIN 3.9 12/02/2021 0812   ALBUMIN 4.0 03/02/2014 0810   AST 23 12/02/2021 0812   AST 27 03/02/2014 0810   ALT 17 12/02/2021 0812   ALT 28 03/02/2014 0810   ALKPHOS 50 12/02/2021 0812   ALKPHOS 77 03/02/2014 0810   BILITOT 1.8 (H) 12/02/2021 0812   BILITOT 1.5 (H) 03/02/2014 0810   GFRNONAA >60 12/02/2021 0812   GFRNONAA >60 03/02/2014 0810   GFRAA >60 03/02/2014 0810    No results found for: SPEP, UPEP  Lab Results  Component Value Date   WBC 3.7 (L) 12/02/2021   NEUTROABS 2.0 12/02/2021   HGB 8.9 (L) 12/02/2021   HCT 26.0 (L) 12/02/2021   MCV 116.6 (H) 12/02/2021   PLT 193 12/02/2021      Chemistry      Component Value Date/Time   NA 139  12/02/2021 0812   NA 142 03/02/2014 0810   K 4.0 12/02/2021 0812   K 3.9 03/02/2014 0810   CL 110 12/02/2021 0812   CL 109 (H) 03/02/2014 0810   CO2 24 12/02/2021 0812   CO2 22 03/02/2014 0810   BUN 15 12/02/2021 0812   BUN 14 03/02/2014 0810   CREATININE 0.56 12/02/2021 0812   CREATININE 0.68 03/02/2014 0810      Component Value  Date/Time   CALCIUM 9.0 12/02/2021 0812   CALCIUM 9.4 03/02/2014 0810   ALKPHOS 50 12/02/2021 0812   ALKPHOS 77 03/02/2014 0810   AST 23 12/02/2021 0812   AST 27 03/02/2014 0810   ALT 17 12/02/2021 0812   ALT 28 03/02/2014 0810   BILITOT 1.8 (H) 12/02/2021 0812   BILITOT 1.5 (H) 03/02/2014 0810       RADIOGRAPHIC STUDIES: I have personally reviewed the radiological images as listed and agreed with the findings in the report. No results found.   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  B12 low at <  50 [JAN 2023; PCP].  Positive for antiparietal antibodies/intrinsic factor antibody; elevated methylmalonic acid; high LDH/low haptoglobin.   #Currently on B12 injections on a daily basis x7 days; and then once every 4 to 4 weeks; then once a month indefinitely.;  Discussed the autoimmune nature of the disease.  Discussed need for lifetime injections.  We will make a referral to GI regarding need for endoscopy.   # DISPOSITION: # b12 injection today # schedule B12 injection tomorrow # Schedule B12 injection weekly x4. # referral to Dr.Anna re: pernicous anemi/need for EGD # follow up in 4 week-MD; labs- cbc/bmp--Dr.B   Orders Placed This Encounter  Procedures   CBC with Differential/Platelet    Standing Status:   Future    Standing Expiration Date:   04/29/3661   Basic metabolic panel    Standing Status:   Future    Standing Expiration Date:   12/02/2022   Ambulatory referral to Gastroenterology    Referral Priority:   Routine    Referral  Type:   Consultation    Referral Reason:   Specialty Services Required    Referred to Provider:   Jonathon Bellows, MD    Number of Visits Requested:   1   All questions were answered. The patient knows to call the clinic with any problems, questions or concerns.      Cammie Sickle, MD 12/02/2021 1:01 PM

## 2021-12-02 NOTE — Assessment & Plan Note (Addendum)
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  B12 low at <  50 [JAN 2023; PCP].  Positive for antiparietal antibodies/intrinsic factor antibody; elevated methylmalonic acid; high LDH/low haptoglobin.   #Currently on B12 injections on a daily basis x7 days; and then once every 4 to 4 weeks; then once a month indefinitely.;  Discussed the autoimmune nature of the disease.  Discussed need for lifetime injections.  We will make a referral to GI regarding need for endoscopy.   # DISPOSITION: # b12 injection today # schedule B12 injection tomorrow # Schedule B12 injection weekly x4. # referral to Dr.Anna re: pernicous anemi/need for EGD # follow up in 4 week-MD; labs- cbc/bmp--Dr.B

## 2021-12-03 ENCOUNTER — Inpatient Hospital Stay: Payer: PPO | Attending: Internal Medicine

## 2021-12-03 ENCOUNTER — Telehealth: Payer: Self-pay

## 2021-12-03 DIAGNOSIS — Z79899 Other long term (current) drug therapy: Secondary | ICD-10-CM | POA: Insufficient documentation

## 2021-12-03 DIAGNOSIS — D7589 Other specified diseases of blood and blood-forming organs: Secondary | ICD-10-CM | POA: Insufficient documentation

## 2021-12-03 DIAGNOSIS — D510 Vitamin B12 deficiency anemia due to intrinsic factor deficiency: Secondary | ICD-10-CM | POA: Insufficient documentation

## 2021-12-03 NOTE — Telephone Encounter (Signed)
CALLED PATIENT NO ANSWER LEFT VOICEMAIL FOR A CALL BACK °Letter sent °

## 2021-12-09 ENCOUNTER — Inpatient Hospital Stay: Payer: PPO

## 2021-12-10 ENCOUNTER — Other Ambulatory Visit: Payer: Self-pay | Admitting: Family Medicine

## 2021-12-10 DIAGNOSIS — Z1231 Encounter for screening mammogram for malignant neoplasm of breast: Secondary | ICD-10-CM

## 2021-12-16 ENCOUNTER — Other Ambulatory Visit: Payer: Self-pay

## 2021-12-16 ENCOUNTER — Inpatient Hospital Stay: Payer: PPO

## 2021-12-16 DIAGNOSIS — D7589 Other specified diseases of blood and blood-forming organs: Secondary | ICD-10-CM | POA: Diagnosis not present

## 2021-12-16 DIAGNOSIS — Z79899 Other long term (current) drug therapy: Secondary | ICD-10-CM | POA: Diagnosis not present

## 2021-12-16 DIAGNOSIS — D649 Anemia, unspecified: Secondary | ICD-10-CM

## 2021-12-16 DIAGNOSIS — D510 Vitamin B12 deficiency anemia due to intrinsic factor deficiency: Secondary | ICD-10-CM | POA: Diagnosis not present

## 2021-12-16 DIAGNOSIS — E538 Deficiency of other specified B group vitamins: Secondary | ICD-10-CM

## 2021-12-16 MED ORDER — CYANOCOBALAMIN 1000 MCG/ML IJ SOLN
1000.0000 ug | Freq: Once | INTRAMUSCULAR | Status: AC
  Administered 2021-12-16: 1000 ug via INTRAMUSCULAR
  Filled 2021-12-16: qty 1

## 2021-12-23 ENCOUNTER — Inpatient Hospital Stay: Payer: PPO

## 2021-12-23 ENCOUNTER — Other Ambulatory Visit: Payer: Self-pay

## 2021-12-23 DIAGNOSIS — E538 Deficiency of other specified B group vitamins: Secondary | ICD-10-CM

## 2021-12-23 DIAGNOSIS — D649 Anemia, unspecified: Secondary | ICD-10-CM

## 2021-12-23 DIAGNOSIS — D510 Vitamin B12 deficiency anemia due to intrinsic factor deficiency: Secondary | ICD-10-CM | POA: Diagnosis not present

## 2021-12-23 MED ORDER — CYANOCOBALAMIN 1000 MCG/ML IJ SOLN
1000.0000 ug | Freq: Once | INTRAMUSCULAR | Status: AC
  Administered 2021-12-23: 1000 ug via INTRAMUSCULAR
  Filled 2021-12-23: qty 1

## 2021-12-30 ENCOUNTER — Inpatient Hospital Stay: Payer: PPO | Admitting: Internal Medicine

## 2021-12-30 ENCOUNTER — Inpatient Hospital Stay: Payer: PPO

## 2021-12-30 ENCOUNTER — Other Ambulatory Visit: Payer: Self-pay

## 2021-12-30 ENCOUNTER — Encounter: Payer: Self-pay | Admitting: Internal Medicine

## 2021-12-30 VITALS — BP 139/75 | HR 70 | Temp 98.7°F | Resp 20 | Ht 63.0 in | Wt 135.4 lb

## 2021-12-30 DIAGNOSIS — D510 Vitamin B12 deficiency anemia due to intrinsic factor deficiency: Secondary | ICD-10-CM

## 2021-12-30 DIAGNOSIS — E538 Deficiency of other specified B group vitamins: Secondary | ICD-10-CM

## 2021-12-30 DIAGNOSIS — D649 Anemia, unspecified: Secondary | ICD-10-CM

## 2021-12-30 LAB — CBC WITH DIFFERENTIAL/PLATELET
Abs Immature Granulocytes: 0.01 10*3/uL (ref 0.00–0.07)
Basophils Absolute: 0 10*3/uL (ref 0.0–0.1)
Basophils Relative: 1 %
Eosinophils Absolute: 0.1 10*3/uL (ref 0.0–0.5)
Eosinophils Relative: 3 %
HCT: 35.3 % — ABNORMAL LOW (ref 36.0–46.0)
Hemoglobin: 11.4 g/dL — ABNORMAL LOW (ref 12.0–15.0)
Immature Granulocytes: 0 %
Lymphocytes Relative: 28 %
Lymphs Abs: 1.1 10*3/uL (ref 0.7–4.0)
MCH: 33.5 pg (ref 26.0–34.0)
MCHC: 32.3 g/dL (ref 30.0–36.0)
MCV: 103.8 fL — ABNORMAL HIGH (ref 80.0–100.0)
Monocytes Absolute: 0.4 10*3/uL (ref 0.1–1.0)
Monocytes Relative: 10 %
Neutro Abs: 2.4 10*3/uL (ref 1.7–7.7)
Neutrophils Relative %: 58 %
Platelets: 167 10*3/uL (ref 150–400)
RBC: 3.4 MIL/uL — ABNORMAL LOW (ref 3.87–5.11)
RDW: 14.5 % (ref 11.5–15.5)
WBC: 4.1 10*3/uL (ref 4.0–10.5)
nRBC: 0 % (ref 0.0–0.2)

## 2021-12-30 LAB — BASIC METABOLIC PANEL
Anion gap: 3 — ABNORMAL LOW (ref 5–15)
BUN: 11 mg/dL (ref 8–23)
CO2: 29 mmol/L (ref 22–32)
Calcium: 9 mg/dL (ref 8.9–10.3)
Chloride: 106 mmol/L (ref 98–111)
Creatinine, Ser: 0.58 mg/dL (ref 0.44–1.00)
GFR, Estimated: >60 mL/min (ref >60)
Glucose, Bld: 95 mg/dL (ref 70–99)
Potassium: 4.4 mmol/L (ref 3.5–5.1)
Sodium: 138 mmol/L (ref 135–145)

## 2021-12-30 MED ORDER — CYANOCOBALAMIN 1000 MCG/ML IJ SOLN
1000.0000 ug | Freq: Once | INTRAMUSCULAR | Status: AC
  Administered 2021-12-30: 1000 ug via INTRAMUSCULAR
  Filled 2021-12-30: qty 1

## 2021-12-30 NOTE — Progress Notes (Signed)
[place] OFFICE PROGRESS NOTE  Patient Care Team: Maryland Pink, MD as PCP - General (Family Medicine)   Cancer Staging  No matching staging information was found for the patient.   Oncology History   No history exists.   #PERNICIOUS anemia-severe B12 deficiency/moderate anemia hemoglobin 9; WBC 2.3; platelets 123-MCV 120.  B12 low at <  50 [JAN 2023; PCP].  Positive for antiparietal antibodies/intrinsic factor antibody; elevated methylmalonic acid; high LDH/low haptoglobin.  B12 injections-  # GI evaluation [Dr.Anna]March 2023   HISTORY OF PRESENT ILLNESS: Ambulating independently.  Alone.   Martha [name] 76 y.o.  female pleasant pernicious anemia/severe B12 deficiency is here for follow-up.  Patient since starting on B12 injections.  Significant improvement of energy levels.  Denies any worsening shortness of breath or cough.  Review of Systems  Constitutional:  Negative for chills, diaphoresis and fever.  HENT:  Negative for nosebleeds and sore throat.   Eyes:  Negative for double vision.  Respiratory:  Negative for cough, hemoptysis, sputum production and wheezing.   Cardiovascular:  Negative for chest pain, palpitations, orthopnea and leg swelling.  Gastrointestinal:  Negative for abdominal pain, blood in stool, constipation, diarrhea, heartburn, melena, nausea and vomiting.  Genitourinary:  Negative for dysuria, frequency and urgency.  Musculoskeletal:  Negative for back pain and joint pain.  Skin: Negative.  Negative for itching and rash.  Neurological:  Negative for tingling, focal weakness, weakness and headaches.  Endo/Heme/Allergies:  Does not bruise/bleed easily.  Psychiatric/Behavioral:  Negative for depression. The patient is not nervous/anxious and does not have insomnia.      PAST MEDICAL HISTORY :  Past Medical History:  Diagnosis Date   Anemia    Hemorrhoids during pregnancy    Hives     PAST SURGICAL HISTORY :   Past Surgical  History:  Procedure Laterality Date   COLONOSCOPY     COLONOSCOPY WITH PROPOFOL N/A 03/30/2018   Procedure: COLONOSCOPY WITH PROPOFOL;  Surgeon: Manya Silvas, MD;  Location: [facility] ENDOSCOPY;  Service: Endoscopy;  Laterality: N/A;   DILATION AND CURETTAGE OF UTERUS      FAMILY HISTORY :   Family History  Problem Relation Age of Onset   Prostate cancer Father    Heart disease Father    Breast cancer Neg Hx     SOCIAL HISTORY:   Social History   Tobacco Use   Smoking status: Never   Smokeless tobacco: Never  Vaping Use   Vaping Use: Never used  Substance Use Topics   Alcohol use: Not Currently    Alcohol/week: 6.0 standard drinks    Types: 6 Cans of beer per week   Drug use: Never    ALLERGIES:  has no allergies on file.  MEDICATIONS:  Current Outpatient Medications  Medication Sig Dispense Refill   furosemide (LASIX) 20 MG tablet Take 20 mg by mouth daily as needed.     lovastatin (MEVACOR) 40 MG tablet Take 40 mg by mouth at bedtime.     mometasone (ELOCON) 0.1 % lotion SMARTSIG:2-4 Drop(s) In Ear(s) Every Night PRN (Patient not taking: Reported on 12/02/2021)     No current facility-administered medications for this visit.    PHYSICAL EXAMINATION: ECOG PERFORMANCE STATUS: 2 - Symptomatic, <50% confined to bed  BP 139/75    Pulse 70    Temp 98.7 F (37.1 C)    Resp 20    Ht 5\' 3"  (1.6 m)    Wt 135 lb 6.4 oz (61.4 kg)  SpO2 100%    BMI 23.99 kg/m   Filed Weights   12/30/21 1028  Weight: 135 lb 6.4 oz (61.4 kg)    Physical Exam Vitals and nursing note reviewed.  HENT:     Head: Normocephalic and atraumatic.     Mouth/Throat:     Pharynx: Oropharynx is clear.  Eyes:     Extraocular Movements: Extraocular movements intact.     Pupils: Pupils are equal, round, and reactive to light.  Cardiovascular:     Rate and Rhythm: Normal rate and regular rhythm.  Pulmonary:     Comments: Decreased breath sounds bilaterally.  Abdominal:     Palpations: Abdomen  is soft.  Musculoskeletal:        General: Normal range of motion.     Cervical back: Normal range of motion.  Skin:    General: Skin is warm.  Neurological:     General: No focal deficit present.     Mental Status: She is alert and oriented to person, place, and time.  Psychiatric:        Behavior: Behavior normal.        Judgment: Judgment normal.       LABORATORY DATA:  I have reviewed the data as listed    Component Value Date/Time   NA 138 12/30/2021 1005   NA 142 03/02/2014 0810   K 4.4 12/30/2021 1005   K 3.9 03/02/2014 0810   CL 106 12/30/2021 1005   CL 109 (H) 03/02/2014 0810   CO2 29 12/30/2021 1005   CO2 22 03/02/2014 0810   GLUCOSE 95 12/30/2021 1005   GLUCOSE 103 (H) 03/02/2014 0810   BUN 11 12/30/2021 1005   BUN 14 03/02/2014 0810   CREATININE 0.58 12/30/2021 1005   CREATININE 0.68 03/02/2014 0810   CALCIUM 9.0 12/30/2021 1005   CALCIUM 9.4 03/02/2014 0810   PROT 6.0 (L) 12/02/2021 0812   PROT 7.7 03/02/2014 0810   ALBUMIN 3.9 12/02/2021 0812   ALBUMIN 4.0 03/02/2014 0810   AST 23 12/02/2021 0812   AST 27 03/02/2014 0810   ALT 17 12/02/2021 0812   ALT 28 03/02/2014 0810   ALKPHOS 50 12/02/2021 0812   ALKPHOS 77 03/02/2014 0810   BILITOT 1.8 (H) 12/02/2021 0812   BILITOT 1.5 (H) 03/02/2014 0810   GFRNONAA >60 12/30/2021 1005   GFRNONAA >60 03/02/2014 0810   GFRAA >60 03/02/2014 0810    No results found for: SPEP, UPEP  Lab Results  Component Value Date   WBC 4.1 12/30/2021   NEUTROABS 2.4 12/30/2021   HGB 11.4 (L) 12/30/2021   HCT 35.3 (L) 12/30/2021   MCV 103.8 (H) 12/30/2021   PLT 167 12/30/2021      Chemistry      Component Value Date/Time   NA 138 12/30/2021 1005   NA 142 03/02/2014 0810   K 4.4 12/30/2021 1005   K 3.9 03/02/2014 0810   CL 106 12/30/2021 1005   CL 109 (H) 03/02/2014 0810   CO2 29 12/30/2021 1005   CO2 22 03/02/2014 0810   BUN 11 12/30/2021 1005   BUN 14 03/02/2014 0810   CREATININE 0.58 12/30/2021 1005    CREATININE 0.68 03/02/2014 0810      Component Value Date/Time   CALCIUM 9.0 12/30/2021 1005   CALCIUM 9.4 03/02/2014 0810   ALKPHOS 50 12/02/2021 0812   ALKPHOS 77 03/02/2014 0810   AST 23 12/02/2021 0812   AST 27 03/02/2014 0810   ALT 17 12/02/2021 7616  ALT 28 03/02/2014 0810   BILITOT 1.8 (H) 12/02/2021 0812   BILITOT 1.5 (H) 03/02/2014 0810       RADIOGRAPHIC STUDIES: I have personally reviewed the radiological images as listed and agreed with the findings in the report. No results found.   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  B12 low at <  50 [JAN 2023; PCP].   #Stressed the importance of monthly B12 injections.  Patient's hemoglobin today is 11; mild macrocytosis MCV 103.  Patient will need lifelong B12 injections.   # GI evaluation- 4/13 Dr.Anna re: pernicous anemi/need for EGD.  # DISPOSITION: # B12 injection today # Monthly B12 injection x4 # follow up in 4 months-MD; labs- cbc/bmp; B12 level- B12 injection---Dr.B   Orders Placed This Encounter  Procedures   CBC with Differential/Platelet    Standing Status:   Future    Standing Expiration Date:   8/76/8115   Basic metabolic panel    Standing Status:   Future    Standing Expiration Date:   12/30/2022   Vitamin B12    Standing Status:   Future    Standing Expiration Date:   12/30/2022   All questions were answered. The patient knows to call the clinic with any problems, questions or concerns.      Cammie Sickle, MD 12/30/2021 11:23 AM

## 2021-12-30 NOTE — Assessment & Plan Note (Signed)
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  B12 low at <  50 [JAN 2023; PCP].   #Stressed the importance of monthly B12 injections.  Patient's hemoglobin today is 11; mild macrocytosis MCV 103.  Patient will need lifelong B12 injections.   # GI evaluation- 4/13 Dr.Anna re: pernicous anemi/need for EGD.  # DISPOSITION: # B12 injection today # Monthly B12 injection x4 # follow up in 4 months-MD; labs- cbc/bmp; B12 level- B12 injection---Dr.B

## 2021-12-30 NOTE — Progress Notes (Signed)
Patient has no concerns at the moment. 

## 2022-01-14 ENCOUNTER — Other Ambulatory Visit: Payer: Self-pay

## 2022-01-14 ENCOUNTER — Ambulatory Visit
Admission: RE | Admit: 2022-01-14 | Discharge: 2022-01-14 | Disposition: A | Discharge status: Home / Self Care | Payer: PPO | Source: Ambulatory Visit | Attending: Family Medicine | Admitting: Family Medicine

## 2022-01-14 DIAGNOSIS — Z1231 Encounter for screening mammogram for malignant neoplasm of breast: Secondary | ICD-10-CM | POA: Diagnosis not present

## 2022-01-27 ENCOUNTER — Other Ambulatory Visit: Payer: Self-pay

## 2022-01-27 ENCOUNTER — Inpatient Hospital Stay: Payer: PPO | Attending: Internal Medicine

## 2022-01-27 DIAGNOSIS — D510 Vitamin B12 deficiency anemia due to intrinsic factor deficiency: Secondary | ICD-10-CM | POA: Insufficient documentation

## 2022-01-27 DIAGNOSIS — D649 Anemia, unspecified: Secondary | ICD-10-CM

## 2022-01-27 DIAGNOSIS — Z79899 Other long term (current) drug therapy: Secondary | ICD-10-CM | POA: Diagnosis not present

## 2022-01-27 DIAGNOSIS — E538 Deficiency of other specified B group vitamins: Secondary | ICD-10-CM

## 2022-01-27 MED ORDER — CYANOCOBALAMIN 1000 MCG/ML IJ SOLN
1000.0000 ug | Freq: Once | INTRAMUSCULAR | Status: AC
  Administered 2022-01-27: 1000 ug via INTRAMUSCULAR
  Filled 2022-01-27: qty 1

## 2022-02-12 ENCOUNTER — Ambulatory Visit: Payer: PPO | Admitting: Gastroenterology

## 2022-02-24 ENCOUNTER — Inpatient Hospital Stay: Payer: PPO | Attending: Internal Medicine

## 2022-02-24 DIAGNOSIS — D510 Vitamin B12 deficiency anemia due to intrinsic factor deficiency: Secondary | ICD-10-CM | POA: Insufficient documentation

## 2022-02-24 DIAGNOSIS — D649 Anemia, unspecified: Secondary | ICD-10-CM

## 2022-02-24 DIAGNOSIS — E538 Deficiency of other specified B group vitamins: Secondary | ICD-10-CM

## 2022-02-24 MED ORDER — CYANOCOBALAMIN 1000 MCG/ML IJ SOLN
1000.0000 ug | Freq: Once | INTRAMUSCULAR | Status: AC
  Administered 2022-02-24: 1000 ug via INTRAMUSCULAR
  Filled 2022-02-24: qty 1

## 2022-03-19 DIAGNOSIS — Z0000 Encounter for general adult medical examination without abnormal findings: Secondary | ICD-10-CM | POA: Diagnosis not present

## 2022-03-19 DIAGNOSIS — E785 Hyperlipidemia, unspecified: Secondary | ICD-10-CM | POA: Diagnosis not present

## 2022-03-20 DIAGNOSIS — N390 Urinary tract infection, site not specified: Secondary | ICD-10-CM | POA: Diagnosis not present

## 2022-03-24 ENCOUNTER — Inpatient Hospital Stay: Payer: PPO | Attending: Internal Medicine

## 2022-03-24 DIAGNOSIS — Z79899 Other long term (current) drug therapy: Secondary | ICD-10-CM | POA: Diagnosis not present

## 2022-03-24 DIAGNOSIS — D510 Vitamin B12 deficiency anemia due to intrinsic factor deficiency: Secondary | ICD-10-CM | POA: Diagnosis not present

## 2022-03-24 DIAGNOSIS — D649 Anemia, unspecified: Secondary | ICD-10-CM

## 2022-03-24 DIAGNOSIS — E538 Deficiency of other specified B group vitamins: Secondary | ICD-10-CM

## 2022-03-24 MED ORDER — CYANOCOBALAMIN 1000 MCG/ML IJ SOLN
1000.0000 ug | Freq: Once | INTRAMUSCULAR | Status: AC
  Administered 2022-03-24: 1000 ug via INTRAMUSCULAR
  Filled 2022-03-24: qty 1

## 2022-03-26 DIAGNOSIS — E785 Hyperlipidemia, unspecified: Secondary | ICD-10-CM | POA: Diagnosis not present

## 2022-03-26 DIAGNOSIS — D510 Vitamin B12 deficiency anemia due to intrinsic factor deficiency: Secondary | ICD-10-CM | POA: Diagnosis not present

## 2022-03-26 DIAGNOSIS — E538 Deficiency of other specified B group vitamins: Secondary | ICD-10-CM | POA: Diagnosis not present

## 2022-03-26 DIAGNOSIS — Z0000 Encounter for general adult medical examination without abnormal findings: Secondary | ICD-10-CM | POA: Diagnosis not present

## 2022-04-21 ENCOUNTER — Inpatient Hospital Stay (HOSPITAL_BASED_OUTPATIENT_CLINIC_OR_DEPARTMENT_OTHER): Payer: PPO | Admitting: Internal Medicine

## 2022-04-21 ENCOUNTER — Other Ambulatory Visit: Payer: Self-pay

## 2022-04-21 ENCOUNTER — Inpatient Hospital Stay: Payer: PPO | Attending: Internal Medicine

## 2022-04-21 ENCOUNTER — Encounter: Payer: Self-pay | Admitting: Internal Medicine

## 2022-04-21 ENCOUNTER — Inpatient Hospital Stay: Payer: PPO

## 2022-04-21 DIAGNOSIS — Z79899 Other long term (current) drug therapy: Secondary | ICD-10-CM | POA: Diagnosis not present

## 2022-04-21 DIAGNOSIS — D510 Vitamin B12 deficiency anemia due to intrinsic factor deficiency: Secondary | ICD-10-CM | POA: Insufficient documentation

## 2022-04-21 DIAGNOSIS — D649 Anemia, unspecified: Secondary | ICD-10-CM

## 2022-04-21 DIAGNOSIS — E538 Deficiency of other specified B group vitamins: Secondary | ICD-10-CM

## 2022-04-21 LAB — BASIC METABOLIC PANEL
Anion gap: 4 — ABNORMAL LOW (ref 5–15)
BUN: 12 mg/dL (ref 8–23)
CO2: 27 mmol/L (ref 22–32)
Calcium: 8.9 mg/dL (ref 8.9–10.3)
Chloride: 108 mmol/L (ref 98–111)
Creatinine, Ser: 0.87 mg/dL (ref 0.44–1.00)
GFR, Estimated: >60 mL/min (ref >60)
Glucose, Bld: 87 mg/dL (ref 70–99)
Potassium: 4.1 mmol/L (ref 3.5–5.1)
Sodium: 139 mmol/L (ref 135–145)

## 2022-04-21 LAB — CBC WITH DIFFERENTIAL/PLATELET
Abs Immature Granulocytes: 0 10*3/uL (ref 0.00–0.07)
Basophils Absolute: 0 10*3/uL (ref 0.0–0.1)
Basophils Relative: 1 %
Eosinophils Absolute: 0.1 10*3/uL (ref 0.0–0.5)
Eosinophils Relative: 3 %
HCT: 37.6 % (ref 36.0–46.0)
Hemoglobin: 12.4 g/dL (ref 12.0–15.0)
Immature Granulocytes: 0 %
Lymphocytes Relative: 28 %
Lymphs Abs: 1.2 10*3/uL (ref 0.7–4.0)
MCH: 29.5 pg (ref 26.0–34.0)
MCHC: 33 g/dL (ref 30.0–36.0)
MCV: 89.3 fL (ref 80.0–100.0)
Monocytes Absolute: 0.4 10*3/uL (ref 0.1–1.0)
Monocytes Relative: 9 %
Neutro Abs: 2.5 10*3/uL (ref 1.7–7.7)
Neutrophils Relative %: 59 %
Platelets: 155 10*3/uL (ref 150–400)
RBC: 4.21 MIL/uL (ref 3.87–5.11)
RDW: 13.4 % (ref 11.5–15.5)
WBC: 4.2 10*3/uL (ref 4.0–10.5)
nRBC: 0 % (ref 0.0–0.2)

## 2022-04-21 LAB — VITAMIN B12: Vitamin B-12: 149 pg/mL — ABNORMAL LOW (ref 180–914)

## 2022-04-21 MED ORDER — CYANOCOBALAMIN 1000 MCG/ML IJ SOLN
1000.0000 ug | Freq: Once | INTRAMUSCULAR | Status: AC
  Administered 2022-04-21: 1000 ug via INTRAMUSCULAR
  Filled 2022-04-21: qty 1

## 2022-04-21 NOTE — Assessment & Plan Note (Addendum)
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  B12 low at <  50 [JAN 2023; PCP].   #Stressed the importance of monthly B12 injections.  Patient's hemoglobin today is 12.3;Patient will need lifelong B12 injections.   # GI evaluation- 4/13 Dr.Anna re: pernicous anemi/need for EGD; recommend calling GI office re: re-scheduling EGD.   # DISPOSITION: # B12 injection today # Monthly B12 injection x 8 # follow up in 8 months-MD; labs- cbc/bmp; B12 level- B12 injection---Dr.B

## 2022-04-21 NOTE — Progress Notes (Signed)
[place] OFFICE PROGRESS NOTE  Patient Care Team: Maryland Pink, MD as PCP - General (Family Medicine) Cammie Sickle, MD as Consulting Physician (Oncology)   Cancer Staging  No matching staging information was found for the patient.   Oncology History   No history exists.   #PERNICIOUS anemia-severe B12 deficiency/moderate anemia hemoglobin 9; WBC 2.3; platelets 123-MCV 120.  B12 low at <  50 [JAN 2023; PCP].  Positive for antiparietal antibodies/intrinsic factor antibody; elevated methylmalonic acid; high LDH/low haptoglobin.  B12 injections-  # GI evaluation [Dr.Anna]March 2023   HISTORY OF PRESENT ILLNESS: Ambulating independently.  Alone.   Martha [name] 76 y.o.  female pleasant pernicious anemia/severe B12 deficiency is here for follow-up.  Patient since starting on B12 injections.  Significant improvement of energy levels.  Denies any worsening shortness of breath or cough.  Review of Systems  Constitutional:  Negative for chills, diaphoresis and fever.  HENT:  Negative for nosebleeds and sore throat.   Eyes:  Negative for double vision.  Respiratory:  Negative for cough, hemoptysis, sputum production and wheezing.   Cardiovascular:  Negative for chest pain, palpitations, orthopnea and leg swelling.  Gastrointestinal:  Negative for abdominal pain, blood in stool, constipation, diarrhea, heartburn, melena, nausea and vomiting.  Genitourinary:  Negative for dysuria, frequency and urgency.  Musculoskeletal:  Negative for back pain and joint pain.  Skin: Negative.  Negative for itching and rash.  Neurological:  Negative for tingling, focal weakness, weakness and headaches.  Endo/Heme/Allergies:  Does not bruise/bleed easily.  Psychiatric/Behavioral:  Negative for depression. The patient is not nervous/anxious and does not have insomnia.      PAST MEDICAL HISTORY :  Past Medical History:  Diagnosis Date  . Anemia   . Hemorrhoids during pregnancy    . Hives     PAST SURGICAL HISTORY :   Past Surgical History:  Procedure Laterality Date  . COLONOSCOPY    . COLONOSCOPY WITH PROPOFOL N/A 03/30/2018   Procedure: COLONOSCOPY WITH PROPOFOL;  Surgeon: Manya Silvas, MD;  Location: [facility] ENDOSCOPY;  Service: Endoscopy;  Laterality: N/A;  . DILATION AND CURETTAGE OF UTERUS      FAMILY HISTORY :   Family History  Problem Relation Age of Onset  . Prostate cancer Father   . Heart disease Father   . Breast cancer Neg Hx     SOCIAL HISTORY:   Social History   Tobacco Use  . Smoking status: Never  . Smokeless tobacco: Never  Vaping Use  . Vaping Use: Never used  Substance Use Topics  . Alcohol use: Not Currently    Alcohol/week: 6.0 standard drinks of alcohol    Types: 6 Cans of beer per week  . Drug use: Never    ALLERGIES:  has no allergies on file.  MEDICATIONS:  Current Outpatient Medications  Medication Sig Dispense Refill  . furosemide (LASIX) 20 MG tablet Take 20 mg by mouth daily as needed.    . lovastatin (MEVACOR) 40 MG tablet Take 40 mg by mouth at bedtime.     No current facility-administered medications for this visit.    PHYSICAL EXAMINATION: ECOG PERFORMANCE STATUS: 2 - Symptomatic, <50% confined to bed  BP 118/75 (BP Location: Left Arm, Patient Position: Sitting, Cuff Size: Normal)   Pulse 70   Temp (!) 97.3 F (36.3 C) (Tympanic)   Ht '5\' 3"'$  (1.6 m)   Wt 142 lb 6.4 oz (64.6 kg)   SpO2 98%   BMI 25.23 kg/m  Filed Weights   04/21/22 1014  Weight: 142 lb 6.4 oz (64.6 kg)    Physical Exam Vitals and nursing note reviewed.  HENT:     Head: Normocephalic and atraumatic.     Mouth/Throat:     Pharynx: Oropharynx is clear.  Eyes:     Extraocular Movements: Extraocular movements intact.     Pupils: Pupils are equal, round, and reactive to light.  Cardiovascular:     Rate and Rhythm: Normal rate and regular rhythm.  Pulmonary:     Comments: Decreased breath sounds bilaterally.  Abdominal:      Palpations: Abdomen is soft.  Musculoskeletal:        General: Normal range of motion.     Cervical back: Normal range of motion.  Skin:    General: Skin is warm.  Neurological:     General: No focal deficit present.     Mental Status: She is alert and oriented to person, place, and time.  Psychiatric:        Behavior: Behavior normal.        Judgment: Judgment normal.       LABORATORY DATA:  I have reviewed the data as listed    Component Value Date/Time   NA 139 04/21/2022 1004   NA 142 03/02/2014 0810   K 4.1 04/21/2022 1004   K 3.9 03/02/2014 0810   CL 108 04/21/2022 1004   CL 109 (H) 03/02/2014 0810   CO2 27 04/21/2022 1004   CO2 22 03/02/2014 0810   GLUCOSE 87 04/21/2022 1004   GLUCOSE 103 (H) 03/02/2014 0810   BUN 12 04/21/2022 1004   BUN 14 03/02/2014 0810   CREATININE 0.87 04/21/2022 1004   CREATININE 0.68 03/02/2014 0810   CALCIUM 8.9 04/21/2022 1004   CALCIUM 9.4 03/02/2014 0810   PROT 6.0 (L) 12/02/2021 0812   PROT 7.7 03/02/2014 0810   ALBUMIN 3.9 12/02/2021 0812   ALBUMIN 4.0 03/02/2014 0810   AST 23 12/02/2021 0812   AST 27 03/02/2014 0810   ALT 17 12/02/2021 0812   ALT 28 03/02/2014 0810   ALKPHOS 50 12/02/2021 0812   ALKPHOS 77 03/02/2014 0810   BILITOT 1.8 (H) 12/02/2021 0812   BILITOT 1.5 (H) 03/02/2014 0810   GFRNONAA >60 04/21/2022 1004   GFRNONAA >60 03/02/2014 0810   GFRAA >60 03/02/2014 0810    No results found for: "SPEP", "UPEP"  Lab Results  Component Value Date   WBC 4.2 04/21/2022   NEUTROABS 2.5 04/21/2022   HGB 12.4 04/21/2022   HCT 37.6 04/21/2022   MCV 89.3 04/21/2022   PLT 155 04/21/2022      Chemistry      Component Value Date/Time   NA 139 04/21/2022 1004   NA 142 03/02/2014 0810   K 4.1 04/21/2022 1004   K 3.9 03/02/2014 0810   CL 108 04/21/2022 1004   CL 109 (H) 03/02/2014 0810   CO2 27 04/21/2022 1004   CO2 22 03/02/2014 0810   BUN 12 04/21/2022 1004   BUN 14 03/02/2014 0810   CREATININE 0.87  04/21/2022 1004   CREATININE 0.68 03/02/2014 0810      Component Value Date/Time   CALCIUM 8.9 04/21/2022 1004   CALCIUM 9.4 03/02/2014 0810   ALKPHOS 50 12/02/2021 0812   ALKPHOS 77 03/02/2014 0810   AST 23 12/02/2021 0812   AST 27 03/02/2014 0810   ALT 17 12/02/2021 0812   ALT 28 03/02/2014 0810   BILITOT 1.8 (H) 12/02/2021 6734  BILITOT 1.5 (H) 03/02/2014 0810       RADIOGRAPHIC STUDIES: I have personally reviewed the radiological images as listed and agreed with the findings in the report. No results found.   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  B12 low at <  50 [JAN 2023; PCP].   #Stressed the importance of monthly B12 injections.  Patient's hemoglobin today is 12.3;Patient will need lifelong B12 injections.   # GI evaluation- 4/13 Dr.Anna re: pernicous anemi/need for EGD; recommend calling GI office re: re-scheduling EGD.   # DISPOSITION: # B12 injection today # Monthly B12 injection x 8 # follow up in 8 months-MD; labs- cbc/bmp; B12 level- B12 injection---Dr.B   Orders Placed This Encounter  Procedures  . CBC with Differential/Platelet    Standing Status:   Future    Standing Expiration Date:   04/22/2023  . Comprehensive metabolic panel    Standing Status:   Future    Standing Expiration Date:   04/22/2023  . Vitamin B12    Standing Status:   Future    Standing Expiration Date:   04/22/2023   All questions were answered. The patient knows to call the clinic with any problems, questions or concerns.      Cammie Sickle, MD 04/21/2022 10:53 AM

## 2022-05-11 DIAGNOSIS — L237 Allergic contact dermatitis due to plants, except food: Secondary | ICD-10-CM | POA: Diagnosis not present

## 2022-05-14 ENCOUNTER — Encounter: Payer: Self-pay | Admitting: Internal Medicine

## 2022-05-21 ENCOUNTER — Inpatient Hospital Stay: Payer: PPO | Attending: Internal Medicine

## 2022-05-21 DIAGNOSIS — Z79899 Other long term (current) drug therapy: Secondary | ICD-10-CM | POA: Insufficient documentation

## 2022-05-21 DIAGNOSIS — D649 Anemia, unspecified: Secondary | ICD-10-CM

## 2022-05-21 DIAGNOSIS — D510 Vitamin B12 deficiency anemia due to intrinsic factor deficiency: Secondary | ICD-10-CM | POA: Diagnosis not present

## 2022-05-21 DIAGNOSIS — E538 Deficiency of other specified B group vitamins: Secondary | ICD-10-CM

## 2022-05-21 MED ORDER — CYANOCOBALAMIN 1000 MCG/ML IJ SOLN
1000.0000 ug | Freq: Once | INTRAMUSCULAR | Status: AC
  Administered 2022-05-21: 1000 ug via INTRAMUSCULAR
  Filled 2022-05-21: qty 1

## 2022-05-29 DIAGNOSIS — L298 Other pruritus: Secondary | ICD-10-CM | POA: Diagnosis not present

## 2022-06-22 ENCOUNTER — Inpatient Hospital Stay: Payer: PPO | Attending: Internal Medicine

## 2022-06-22 DIAGNOSIS — D510 Vitamin B12 deficiency anemia due to intrinsic factor deficiency: Secondary | ICD-10-CM | POA: Diagnosis not present

## 2022-06-22 DIAGNOSIS — Z79899 Other long term (current) drug therapy: Secondary | ICD-10-CM | POA: Insufficient documentation

## 2022-06-22 DIAGNOSIS — D649 Anemia, unspecified: Secondary | ICD-10-CM

## 2022-06-22 DIAGNOSIS — E538 Deficiency of other specified B group vitamins: Secondary | ICD-10-CM

## 2022-06-22 MED ORDER — CYANOCOBALAMIN 1000 MCG/ML IJ SOLN
1000.0000 ug | Freq: Once | INTRAMUSCULAR | Status: AC
  Administered 2022-06-22: 1000 ug via INTRAMUSCULAR
  Filled 2022-06-22: qty 1

## 2022-06-23 DIAGNOSIS — L298 Other pruritus: Secondary | ICD-10-CM | POA: Diagnosis not present

## 2022-07-22 ENCOUNTER — Inpatient Hospital Stay: Payer: PPO | Attending: Internal Medicine

## 2022-07-22 DIAGNOSIS — D510 Vitamin B12 deficiency anemia due to intrinsic factor deficiency: Secondary | ICD-10-CM | POA: Diagnosis not present

## 2022-07-22 DIAGNOSIS — Z79899 Other long term (current) drug therapy: Secondary | ICD-10-CM | POA: Diagnosis not present

## 2022-07-22 DIAGNOSIS — D649 Anemia, unspecified: Secondary | ICD-10-CM

## 2022-07-22 DIAGNOSIS — E538 Deficiency of other specified B group vitamins: Secondary | ICD-10-CM

## 2022-07-22 MED ORDER — CYANOCOBALAMIN 1000 MCG/ML IJ SOLN
1000.0000 ug | Freq: Once | INTRAMUSCULAR | Status: AC
  Administered 2022-07-22: 1000 ug via INTRAMUSCULAR
  Filled 2022-07-22: qty 1

## 2022-08-07 DIAGNOSIS — Z23 Encounter for immunization: Secondary | ICD-10-CM | POA: Diagnosis not present

## 2022-08-21 ENCOUNTER — Inpatient Hospital Stay: Payer: PPO | Attending: Internal Medicine

## 2022-08-21 DIAGNOSIS — D510 Vitamin B12 deficiency anemia due to intrinsic factor deficiency: Secondary | ICD-10-CM | POA: Diagnosis not present

## 2022-08-21 DIAGNOSIS — D649 Anemia, unspecified: Secondary | ICD-10-CM

## 2022-08-21 DIAGNOSIS — E538 Deficiency of other specified B group vitamins: Secondary | ICD-10-CM

## 2022-08-21 MED ORDER — CYANOCOBALAMIN 1000 MCG/ML IJ SOLN
1000.0000 ug | Freq: Once | INTRAMUSCULAR | Status: AC
  Administered 2022-08-21: 1000 ug via INTRAMUSCULAR
  Filled 2022-08-21: qty 1

## 2022-09-03 ENCOUNTER — Ambulatory Visit (INDEPENDENT_AMBULATORY_CARE_PROVIDER_SITE_OTHER): Payer: PPO | Admitting: Gastroenterology

## 2022-09-03 ENCOUNTER — Encounter: Payer: Self-pay | Admitting: Gastroenterology

## 2022-09-03 ENCOUNTER — Other Ambulatory Visit: Payer: Self-pay

## 2022-09-03 VITALS — BP 145/82 | HR 77 | Temp 98.5°F | Ht 63.0 in | Wt 151.4 lb

## 2022-09-03 DIAGNOSIS — E538 Deficiency of other specified B group vitamins: Secondary | ICD-10-CM | POA: Diagnosis not present

## 2022-09-03 NOTE — Progress Notes (Signed)
    Jonathon Bellows MD, MRCP(U.K) [address]  [place]  [place], [place] [address]  Main: [phone]  Fax: [phone]   Gastroenterology Consultation  Referring Provider: Dr. Rogue Bussing Primary Care Physician:  [place] Pink, MD Primary Gastroenterologist:  Dr. Jonathon Bellows  Reason for Consultation:     Stomach issues        HPI:   Martha [name] is a 76 y.o. y/o female referred for evaluation due to history of B12 deficiency by Dr. Rogue Bussing  No GI symptoms. 05/2022: cbc, cmp- normal  03/2018: colonoscopy : normal   Past Medical History:  Diagnosis Date   Anemia    Hemorrhoids during pregnancy    Hives     Past Surgical History:  Procedure Laterality Date   COLONOSCOPY     COLONOSCOPY WITH PROPOFOL N/A 03/30/2018   Procedure: COLONOSCOPY WITH PROPOFOL;  Surgeon: Manya Silvas, MD;  Location: [facility] ENDOSCOPY;  Service: Endoscopy;  Laterality: N/A;   DILATION AND CURETTAGE OF UTERUS      Prior to Admission medications   Medication Sig Start Date End Date Taking? Authorizing Provider  cetirizine (ZYRTEC) 10 MG tablet Take 10 mg by mouth daily.    [provider]  furosemide (LASIX) 20 MG tablet Take 20 mg by mouth daily as needed.    [provider]  lovastatin (MEVACOR) 40 MG tablet Take 40 mg by mouth at bedtime.    [provider]  montelukast (SINGULAIR) 10 MG tablet Take 10 mg by mouth daily. 05/20/22   [provider]  predniSONE (DELTASONE) 10 MG tablet Taper 6,6,5,5,4,4,3,3,2,2,1,1,off 06/23/22   [provider]    Family History  Problem Relation Age of Onset   Prostate cancer Father    Heart disease Father    Breast cancer Neg Hx      Social History   Tobacco Use   Smoking status: Never   Smokeless tobacco: Never  Vaping Use   Vaping Use: Never used  Substance Use Topics   Alcohol use: Not Currently    Alcohol/week: 6.0 standard drinks of alcohol    Types: 6 Cans of beer per week   Drug use:  Never    Allergies as of 09/03/2022   (Not on File)    Review of Systems:    All systems reviewed and negative except where noted in HPI.   Physical Exam:  There were no vitals taken for this visit. No LMP recorded. Patient is postmenopausal. Psych:  Alert and cooperative. Normal mood and affect. General:   Alert,  Well-developed, well-nourished, pleasant and cooperative in NAD Head:  Normocephalic and atraumatic. Neurologic:  Alert and oriented x3;  grossly normal neurologically. Psych:  Alert and cooperative. Normal mood and affect.  Imaging Studies: No results found.  Assessment and Plan:   Martha [name] is a 76 y.o. y/o female with a history of severe B12 deficiency was of 149 in June 2023.  Here today to schedule an upper endoscopy to evaluate the stomach for atrophic gastritis.  Plan 1.  EGD with biopsies of stomach to evaluate mucosa 2.  Check B12 levels   I have discussed alternative options, risks & benefits,  which include, but are not limited to, bleeding, infection, perforation,respiratory complication & drug reaction.  The patient agrees with this plan & written consent will be obtained.     Follow up in as needed  Dr Jonathon Bellows MD,MRCP(U.K)

## 2022-09-03 NOTE — Addendum Note (Signed)
Addended by: Jacqualin Combes on: 09/03/2022 02:20 PM   Modules accepted: Orders

## 2022-09-04 LAB — B12 AND FOLATE PANEL
Folate: 14.9 ng/mL (ref >3.0)
Vitamin B-12: 291 pg/mL (ref 232–1245)

## 2022-09-14 ENCOUNTER — Telehealth: Payer: Self-pay | Admitting: Gastroenterology

## 2022-09-14 NOTE — Telephone Encounter (Signed)
Pt callead and cancelled appointment for procedure on 10/05/2022  will call back after the holidays to resched

## 2022-09-21 ENCOUNTER — Inpatient Hospital Stay: Payer: PPO | Attending: Internal Medicine

## 2022-09-21 DIAGNOSIS — E538 Deficiency of other specified B group vitamins: Secondary | ICD-10-CM

## 2022-09-21 DIAGNOSIS — D649 Anemia, unspecified: Secondary | ICD-10-CM

## 2022-09-21 DIAGNOSIS — Z79899 Other long term (current) drug therapy: Secondary | ICD-10-CM | POA: Diagnosis not present

## 2022-09-21 DIAGNOSIS — D510 Vitamin B12 deficiency anemia due to intrinsic factor deficiency: Secondary | ICD-10-CM | POA: Insufficient documentation

## 2022-09-21 MED ORDER — CYANOCOBALAMIN 1000 MCG/ML IJ SOLN
1000.0000 ug | Freq: Once | INTRAMUSCULAR | Status: AC
  Administered 2022-09-21: 1000 ug via INTRAMUSCULAR

## 2022-09-30 NOTE — Telephone Encounter (Signed)
Patient called to verify that her procedure has been canceled. Procedure needs to be canceled.

## 2022-10-01 NOTE — Telephone Encounter (Signed)
Called the endoscopy unit and spoke to [facility] to let her know that the patient is cancelling her procedure.

## 2022-10-05 ENCOUNTER — Ambulatory Visit: Admission: RE | Admit: 2022-10-05 | Payer: PPO | Source: Home / Self Care | Admitting: Gastroenterology

## 2022-10-05 ENCOUNTER — Encounter: Admission: RE | Payer: Self-pay | Source: Home / Self Care

## 2022-10-05 SURGERY — ESOPHAGOGASTRODUODENOSCOPY (EGD) WITH PROPOFOL
Anesthesia: General

## 2022-10-21 ENCOUNTER — Inpatient Hospital Stay: Payer: PPO | Attending: Internal Medicine

## 2022-10-21 DIAGNOSIS — D510 Vitamin B12 deficiency anemia due to intrinsic factor deficiency: Secondary | ICD-10-CM | POA: Diagnosis not present

## 2022-10-21 DIAGNOSIS — E538 Deficiency of other specified B group vitamins: Secondary | ICD-10-CM

## 2022-10-21 DIAGNOSIS — D649 Anemia, unspecified: Secondary | ICD-10-CM

## 2022-10-21 MED ORDER — CYANOCOBALAMIN 1000 MCG/ML IJ SOLN
1000.0000 ug | Freq: Once | INTRAMUSCULAR | Status: AC
  Administered 2022-10-21: 1000 ug via INTRAMUSCULAR
  Filled 2022-10-21: qty 1

## 2022-11-23 ENCOUNTER — Inpatient Hospital Stay: Payer: PPO | Attending: Internal Medicine

## 2022-11-23 DIAGNOSIS — D510 Vitamin B12 deficiency anemia due to intrinsic factor deficiency: Secondary | ICD-10-CM | POA: Diagnosis not present

## 2022-11-23 DIAGNOSIS — D649 Anemia, unspecified: Secondary | ICD-10-CM

## 2022-11-23 DIAGNOSIS — Z79899 Other long term (current) drug therapy: Secondary | ICD-10-CM | POA: Insufficient documentation

## 2022-11-23 DIAGNOSIS — E538 Deficiency of other specified B group vitamins: Secondary | ICD-10-CM

## 2022-11-23 MED ORDER — CYANOCOBALAMIN 1000 MCG/ML IJ SOLN
1000.0000 ug | Freq: Once | INTRAMUSCULAR | Status: AC
  Administered 2022-11-23: 1000 ug via INTRAMUSCULAR
  Filled 2022-11-23: qty 1

## 2022-12-21 ENCOUNTER — Encounter: Payer: Self-pay | Admitting: Dermatology

## 2022-12-21 ENCOUNTER — Ambulatory Visit: Payer: PPO | Admitting: Dermatology

## 2022-12-21 VITALS — BP 119/82 | HR 79

## 2022-12-21 DIAGNOSIS — L570 Actinic keratosis: Secondary | ICD-10-CM | POA: Diagnosis not present

## 2022-12-21 DIAGNOSIS — Z79899 Other long term (current) drug therapy: Secondary | ICD-10-CM

## 2022-12-21 DIAGNOSIS — L209 Atopic dermatitis, unspecified: Secondary | ICD-10-CM

## 2022-12-21 DIAGNOSIS — Z7189 Other specified counseling: Secondary | ICD-10-CM | POA: Diagnosis not present

## 2022-12-21 DIAGNOSIS — L578 Other skin changes due to chronic exposure to nonionizing radiation: Secondary | ICD-10-CM | POA: Diagnosis not present

## 2022-12-21 DIAGNOSIS — Z1283 Encounter for screening for malignant neoplasm of skin: Secondary | ICD-10-CM | POA: Diagnosis not present

## 2022-12-21 DIAGNOSIS — L853 Xerosis cutis: Secondary | ICD-10-CM | POA: Diagnosis not present

## 2022-12-21 MED ORDER — MOMETASONE FUROATE 0.1 % EX CREA: TOPICAL_CREAM | CUTANEOUS | 0 refills | Status: DC

## 2022-12-21 NOTE — Progress Notes (Unsigned)
New Patient Visit  Subjective  Martha [name] is a 77 y.o. female who presents for the following: New Patient (Initial Visit) (Hx of itchy rash on back of neck, back, chest, arms, and buttocks that started in July. Patient has been treated with prednisone taper, zyrtec, depo medrol injection. Patient reports still flared. ) and OTHER (Patient is also concerned with spot at left cheek. ). The patient has spots, moles and lesions to be evaluated, some may be new or changing and the patient has concerns that these could be cancer.  The following portions of the chart were reviewed this encounter and updated as appropriate:   Tobacco  Allergies  Meds  Problems  Med Hx  Surg Hx  Fam Hx     Review of Systems:  No other skin or systemic complaints except as noted in HPI or Assessment and Plan.  Objective  Well appearing patient in no apparent distress; mood and affect are within normal limits.  A focused examination was performed including face, back, posterior neck, chest, abdomen. Relevant physical exam findings are noted in the Assessment and Plan.  left cheek x 1 Hypertrophic erythematous thin papule/macule with gritty scale.   b/l arms, back, chest, abdomen , posterior neck Scaly erythematous papules and patches +/- dyspigmentation, lichenification, excoriations.    Assessment & Plan  Actinic keratosis left cheek x 1 Will recheck at neck follow up  Actinic keratoses are precancerous spots that appear secondary to cumulative UV radiation exposure/sun exposure over time. They are chronic with expected duration over 1 year. A portion of actinic keratoses will progress to squamous cell carcinoma of the skin. It is not possible to reliably predict which spots will progress to skin cancer and so treatment is recommended to prevent development of skin cancer.  Recommend daily broad spectrum sunscreen SPF 30+ to sun-exposed areas, reapply every 2 hours as needed.  Recommend staying in  the shade or wearing long sleeves, sun glasses (UVA+UVB protection) and wide brim hats (4-inch brim around the entire circumference of the hat). Call for new or changing lesions.  Destruction of lesion - left cheek x 1 Complexity: simple   Destruction method: cryotherapy   Informed consent: discussed and consent obtained   Timeout:  patient name, date of birth, surgical site, and procedure verified Lesion destroyed using liquid nitrogen: Yes   Region frozen until ice ball extended beyond lesion: Yes   Outcome: patient tolerated procedure well with no complications   Post-procedure details: wound care instructions given    Atopic dermatitis, unspecified type b/l arms, back, chest, abdomen , posterior neck Atopic dermatitis (eczema) is a chronic, relapsing, pruritic condition that can significantly affect quality of life. It is often associated with allergic rhinitis and/or asthma and can require treatment with topical medications, phototherapy, or in severe cases biologic injectable medication (Dupixent; Adbry) or Oral JAK inhibitors.  Continue Dove soap Start cerave cream for daily moisturizer Start mometasone 0.1 % cream - apply topically to aa body for rash qd/bid 5 x weekly prn  Topical steroids (such as triamcinolone, fluocinolone, fluocinonide, mometasone, clobetasol, halobetasol, betamethasone, hydrocortisone) can cause thinning and lightening of the skin if they are used for too long in the same area. Your physician has selected the right strength medicine for your problem and area affected on the body. Please use your medication only as directed by your physician to prevent side effects.   mometasone (ELOCON) 0.1 % cream - b/l arms, back, chest, abdomen , posterior neck Apply  topically to aa body for rash qd/bid prn 5 x weekly  Xerosis - diffuse xerotic patches - recommend gentle, hydrating skin care - gentle skin care handout given  Actinic Damage - chronic, secondary to  cumulative UV radiation exposure/sun exposure over time - diffuse scaly erythematous macules with underlying dyspigmentation - Recommend daily broad spectrum sunscreen SPF 30+ to sun-exposed areas, reapply every 2 hours as needed.  - Recommend staying in the shade or wearing long sleeves, sun glasses (UVA+UVB protection) and wide brim hats (4-inch brim around the entire circumference of the hat). - Call for new or changing lesions.  Return for 6 weeks recheck ak and rash .  IRuthell Rummage, CMA, am acting as scribe for Sarina Ser, MD. Documentation: I have reviewed the above documentation for accuracy and completeness, and I agree with the above.  Sarina Ser, MD

## 2022-12-21 NOTE — Patient Instructions (Addendum)
For Rash   Start mometasone cream - apply topically to affected areas 1 to 2 times daily 5 times weekly.   Topical steroids (such as triamcinolone, fluocinolone, fluocinonide, mometasone, clobetasol, halobetasol, betamethasone, hydrocortisone) can cause thinning and lightening of the skin if they are used for too long in the same area. Your physician has selected the right strength medicine for your problem and area affected on the body. Please use your medication only as directed by your physician to prevent side effects.   Start Cerave Cream - found over the counter - use as a daily moisturizer to skin.  Continue Dove soap.    Gentle Skin Care Guide  1. Bathe no more than once a day.  2. Avoid bathing in hot water  3. Use a mild soap like Dove, Vanicream, Cetaphil, CeraVe. Can use Lever 2000 or Cetaphil antibacterial soap  4. Use soap only where you need it. On most days, use it under your arms, between your legs, and on your feet. Let the water rinse other areas unless visibly dirty.  5. When you get out of the bath/shower, use a towel to gently blot your skin dry, don't rub it.  6. While your skin is still a little damp, apply a moisturizing cream such as Vanicream, CeraVe, Cetaphil, Eucerin, Sarna lotion or plain Vaseline Jelly. For hands apply Neutrogena [place] Hand Cream or Excipial Hand Cream.  7. Reapply moisturizer any time you start to itch or feel dry.  8. Sometimes using free and clear laundry detergents can be helpful. Fabric softener sheets should be avoided. Downy Free & Gentle liquid, or any liquid fabric softener that is free of dyes and perfumes, it acceptable to use  9. If your doctor has given you prescription creams you may apply moisturizers over them    Actinic keratoses are precancerous spots that appear secondary to cumulative UV radiation exposure/sun exposure over time. They are chronic with expected duration over 1 year. A portion of actinic keratoses  will progress to squamous cell carcinoma of the skin. It is not possible to reliably predict which spots will progress to skin cancer and so treatment is recommended to prevent development of skin cancer.  Recommend daily broad spectrum sunscreen SPF 30+ to sun-exposed areas, reapply every 2 hours as needed.  Recommend staying in the shade or wearing long sleeves, sun glasses (UVA+UVB protection) and wide brim hats (4-inch brim around the entire circumference of the hat). Call for new or changing lesions.   Cryotherapy Aftercare  Wash gently with soap and water everyday.   Apply Vaseline and Band-Aid daily until healed.          Due to recent changes in healthcare laws, you may see results of your pathology and/or laboratory studies on MyChart before the doctors have had a chance to review them. We understand that in some cases there may be results that are confusing or concerning to you. Please understand that not all results are received at the same time and often the doctors may need to interpret multiple results in order to provide you with the best plan of care or course of treatment. Therefore, we ask that you please give [place] 2 business days to thoroughly review all your results before contacting the office for clarification. Should we see a critical lab result, you will be contacted sooner.   If You Need Anything After Your Visit  If you have any questions or concerns for your doctor, please call our main line at  [phone] and press option 4 to reach your doctor's medical assistant. If no one answers, please leave a voicemail as directed and we will return your call as soon as possible. Messages left after 4 pm will be answered the following business day.   You may also send [place] a message via [place]. We typically respond to MyChart messages within 1-2 business days.  For prescription refills, please ask your pharmacy to contact our office. Our fax number is [phone].  If you  have an urgent issue when the clinic is closed that cannot wait until the next business day, you can page your doctor at the number below.    Please note that while we do our best to be available for urgent issues outside of office hours, we are not available 24/7.   If you have an urgent issue and are unable to reach [place], you may choose to seek medical care at your doctor's office, retail clinic, urgent care center, or emergency room.  If you have a medical emergency, please immediately call 911 or go to the emergency department.  Pager Numbers  - Dr. Nehemiah Massed: [phone]  - Dr. Laurence Ferrari: [phone]  - Dr. Nicole Kindred: [phone]  In the event of inclement weather, please call our main line at [phone] for an update on the status of any delays or closures.  Dermatology Medication Tips: Please keep the boxes that topical medications come in in order to help keep track of the instructions about where and how to use these. Pharmacies typically print the medication instructions only on the boxes and not directly on the medication tubes.   If your medication is too expensive, please contact our office at [phone] option 4 or send [place] a message through [place].   We are unable to tell what your co-pay for medications will be in advance as this is different depending on your insurance coverage. However, we may be able to find a substitute medication at lower cost or fill out paperwork to get insurance to cover a needed medication.   If a prior authorization is required to get your medication covered by your insurance company, please allow [place] 1-2 business days to complete this process.  Drug prices often vary depending on where the prescription is filled and some pharmacies may offer cheaper prices.  The website www.goodrx.com contains coupons for medications through different pharmacies. The prices here do not account for what the cost may be with help from insurance (it may be cheaper  with your insurance), but the website can give you the price if you did not use any insurance.  - You can print the associated coupon and take it with your prescription to the pharmacy.  - You may also stop by our office during regular business hours and pick up a GoodRx coupon card.  - If you need your prescription sent electronically to a different pharmacy, notify our office through [facility] or by phone at [phone] option 4.     Si Usted Necesita Algo Despus de Su Visita  Tambin puede enviarnos un mensaje a travs de Pharmacist, community. Por lo general respondemos a los mensajes de MyChart en el transcurso de 1 a 2 das hbiles.  Para renovar recetas, por favor pida a su farmacia que se ponga en contacto con nuestra oficina. Harland Dingwall de fax es [place] [phone].  Si tiene un asunto urgente cuando la clnica est cerrada y que no puede esperar hasta el siguiente da hbil, puede llamar/localizar a su doctor(a)  al nmero que aparece a continuacin.   Por favor, tenga en cuenta que aunque hacemos todo lo posible para estar disponibles para asuntos urgentes fuera del horario de [place], no estamos disponibles las 24 horas del da, los 7 das de la [place].   Si tiene un problema urgente y no puede comunicarse con nosotros, puede optar por buscar atencin mdica  en el consultorio de su doctor(a), en una clnica privada, en un centro de atencin urgente o en una sala de emergencias.  Si tiene Engineering geologist, por favor llame inmediatamente al 911 o vaya a la sala de emergencias.  Nmeros de bper  - Dr. Nehemiah Massed: [phone]  - Dra. Moye: [phone]  - Dra. Nicole Kindred: [phone]  En caso de inclemencias del [place], por favor llame a Johnsie Kindred principal al [phone] para una actualizacin sobre el [place] de cualquier retraso o cierre.  Consejos para la medicacin en dermatologa: Por favor, guarde las cajas en las que vienen los medicamentos de uso tpico para  ayudarle a seguir las instrucciones sobre dnde y cmo usarlos. Las farmacias generalmente imprimen las instrucciones del medicamento slo en las cajas y no directamente en los tubos del [place].   Si su medicamento es muy caro, por favor, pngase en contacto con Zigmund Daniel llamando al [phone] y presione la opcin 4 o envenos un mensaje a travs de Pharmacist, community.   No podemos decirle cul ser su copago por los medicamentos por adelantado ya que esto es diferente dependiendo de la cobertura de su seguro. Sin embargo, es posible que podamos encontrar un medicamento sustituto a Electrical engineer un formulario para que el seguro cubra el medicamento que se considera necesario.   Si se requiere una autorizacin previa para que su compaa de seguros [place] su medicamento, por favor permtanos de 1 a 2 das hbiles para completar este proceso.  Los precios de los medicamentos varan con frecuencia dependiendo del Environmental consultant de dnde se surte la receta y alguna farmacias pueden ofrecer precios ms baratos.  El sitio web www.goodrx.com tiene cupones para medicamentos de Airline pilot. Los precios aqu no tienen en cuenta lo que podra costar con la ayuda del seguro (puede ser ms barato con su seguro), pero el sitio web puede darle el precio si no utiliz Research scientist (physical sciences).  - Puede imprimir el cupn correspondiente y llevarlo con su receta a la farmacia.  - Tambin puede pasar por nuestra oficina durante el horario de atencin regular y Charity fundraiser una tarjeta de cupones de GoodRx.  - Si necesita que su receta se enve electrnicamente a una farmacia diferente, informe a nuestra oficina a travs de MyChart de [place] o por telfono llamando al [phone] y presione la opcin 4.

## 2022-12-22 ENCOUNTER — Inpatient Hospital Stay (HOSPITAL_BASED_OUTPATIENT_CLINIC_OR_DEPARTMENT_OTHER): Payer: PPO | Admitting: Internal Medicine

## 2022-12-22 ENCOUNTER — Encounter: Payer: Self-pay | Admitting: Dermatology

## 2022-12-22 ENCOUNTER — Inpatient Hospital Stay: Payer: PPO

## 2022-12-22 ENCOUNTER — Encounter: Payer: Self-pay | Admitting: Internal Medicine

## 2022-12-22 ENCOUNTER — Inpatient Hospital Stay: Payer: PPO | Attending: Internal Medicine

## 2022-12-22 VITALS — BP 128/75 | HR 72 | Resp 18 | Ht 63.0 in | Wt 148.0 lb

## 2022-12-22 DIAGNOSIS — D649 Anemia, unspecified: Secondary | ICD-10-CM

## 2022-12-22 DIAGNOSIS — D510 Vitamin B12 deficiency anemia due to intrinsic factor deficiency: Secondary | ICD-10-CM

## 2022-12-22 DIAGNOSIS — Z79899 Other long term (current) drug therapy: Secondary | ICD-10-CM | POA: Insufficient documentation

## 2022-12-22 DIAGNOSIS — E538 Deficiency of other specified B group vitamins: Secondary | ICD-10-CM

## 2022-12-22 LAB — CBC WITH DIFFERENTIAL/PLATELET
Abs Immature Granulocytes: 0.01 10*3/uL (ref 0.00–0.07)
Basophils Absolute: 0 10*3/uL (ref 0.0–0.1)
Basophils Relative: 1 %
Eosinophils Absolute: 0.1 10*3/uL (ref 0.0–0.5)
Eosinophils Relative: 3 %
HCT: 42 % (ref 36.0–46.0)
Hemoglobin: 13.7 g/dL (ref 12.0–15.0)
Immature Granulocytes: 0 %
Lymphocytes Relative: 30 %
Lymphs Abs: 1.2 10*3/uL (ref 0.7–4.0)
MCH: 29 pg (ref 26.0–34.0)
MCHC: 32.6 g/dL (ref 30.0–36.0)
MCV: 89 fL (ref 80.0–100.0)
Monocytes Absolute: 0.4 10*3/uL (ref 0.1–1.0)
Monocytes Relative: 9 %
Neutro Abs: 2.3 10*3/uL (ref 1.7–7.7)
Neutrophils Relative %: 57 %
Platelets: 154 10*3/uL (ref 150–400)
RBC: 4.72 MIL/uL (ref 3.87–5.11)
RDW: 12.8 % (ref 11.5–15.5)
WBC: 4 10*3/uL (ref 4.0–10.5)
nRBC: 0 % (ref 0.0–0.2)

## 2022-12-22 LAB — COMPREHENSIVE METABOLIC PANEL
ALT: 13 U/L (ref 0–44)
AST: 19 U/L (ref 15–41)
Albumin: 4 g/dL (ref 3.5–5.0)
Alkaline Phosphatase: 63 U/L (ref 38–126)
Anion gap: 6 (ref 5–15)
BUN: 10 mg/dL (ref 8–23)
CO2: 27 mmol/L (ref 22–32)
Calcium: 9.2 mg/dL (ref 8.9–10.3)
Chloride: 108 mmol/L (ref 98–111)
Creatinine, Ser: 0.86 mg/dL (ref 0.44–1.00)
GFR, Estimated: >60 mL/min (ref >60)
Glucose, Bld: 96 mg/dL (ref 70–99)
Potassium: 4.5 mmol/L (ref 3.5–5.1)
Sodium: 141 mmol/L (ref 135–145)
Total Bilirubin: 1.4 mg/dL — ABNORMAL HIGH (ref 0.3–1.2)
Total Protein: 6.6 g/dL (ref 6.5–8.1)

## 2022-12-22 LAB — VITAMIN B12: Vitamin B-12: 183 pg/mL (ref 180–914)

## 2022-12-22 MED ORDER — CYANOCOBALAMIN 1000 MCG/ML IJ SOLN
1000.0000 ug | Freq: Once | INTRAMUSCULAR | Status: AC
  Administered 2022-12-22: 1000 ug via INTRAMUSCULAR
  Filled 2022-12-22: qty 1

## 2022-12-22 NOTE — Progress Notes (Signed)
[facility] OFFICE PROGRESS NOTE  Patient Care Team: [place] Pink, MD as PCP - General (Family Medicine) Cammie Sickle, MD as Consulting Physician (Oncology)   Cancer Staging  No matching staging information was found for the patient.   Oncology History   No history exists.   #PERNICIOUS anemia-severe B12 deficiency/moderate anemia hemoglobin 9; WBC 2.3; platelets 123-MCV 120.  B12 low at <  50 [JAN 2023; PCP].  Positive for antiparietal antibodies/intrinsic factor antibody; elevated methylmalonic acid; high LDH/low haptoglobin.  B12 injections-  # GI evaluation [Dr.Anna]March 2023   HISTORY OF PRESENT ILLNESS: Ambulating independently.  Alone.   Martha [name] 77 y.o.  female pleasant pernicious anemia/severe B12 deficiency is here for follow-up.  In the interim patient was evaluated by GI recommended EGD patient declined.   Patient since starting on B12 injections.  Significant improvement of energy levels.  Denies any worsening shortness of breath or cough.  Review of Systems  Constitutional:  Negative for chills, diaphoresis and fever.  HENT:  Negative for nosebleeds and sore throat.   Eyes:  Negative for double vision.  Respiratory:  Negative for cough, hemoptysis, sputum production and wheezing.   Cardiovascular:  Negative for chest pain, palpitations, orthopnea and leg swelling.  Gastrointestinal:  Negative for abdominal pain, blood in stool, constipation, diarrhea, heartburn, melena, nausea and vomiting.  Genitourinary:  Negative for dysuria, frequency and urgency.  Musculoskeletal:  Negative for back pain and joint pain.  Skin: Negative.  Negative for itching and rash.  Neurological:  Negative for tingling, focal weakness, weakness and headaches.  Endo/Heme/Allergies:  Does not bruise/bleed easily.  Psychiatric/Behavioral:  Negative for depression. The patient is not nervous/anxious and does not have insomnia.       PAST MEDICAL HISTORY :   Past Medical History:  Diagnosis Date   Anemia    Hemorrhoids during pregnancy    Hives     PAST SURGICAL HISTORY :   Past Surgical History:  Procedure Laterality Date   COLONOSCOPY     COLONOSCOPY WITH PROPOFOL N/A 03/30/2018   Procedure: COLONOSCOPY WITH PROPOFOL;  Surgeon: Manya Silvas, MD;  Location: [facility] ENDOSCOPY;  Service: Endoscopy;  Laterality: N/A;   DILATION AND CURETTAGE OF UTERUS      FAMILY HISTORY :   Family History  Problem Relation Age of Onset   Prostate cancer Father    Heart disease Father    Breast cancer Neg Hx     SOCIAL HISTORY:   Social History   Tobacco Use   Smoking status: Never   Smokeless tobacco: Never  Vaping Use   Vaping Use: Never used  Substance Use Topics   Alcohol use: Not Currently    Alcohol/week: 6.0 standard drinks of alcohol    Types: 6 Cans of beer per week   Drug use: Never    ALLERGIES:  has No Known Allergies.  MEDICATIONS:  Current Outpatient Medications  Medication Sig Dispense Refill   lovastatin (MEVACOR) 40 MG tablet Take 40 mg by mouth at bedtime.     cetirizine (ZYRTEC) 10 MG tablet Take 10 mg by mouth daily. (Patient not taking: Reported on 12/21/2022)     furosemide (LASIX) 20 MG tablet Take 20 mg by mouth daily. (Patient not taking: Reported on 12/21/2022)     mometasone (ELOCON) 0.1 % cream Apply topically to aa body for rash qd/bid prn 5 x weekly (Patient not taking: Reported on 12/22/2022) 45 g 0   montelukast (SINGULAIR) 10 MG tablet Take  10 mg by mouth daily. (Patient not taking: Reported on 12/21/2022)     No current facility-administered medications for this visit.    PHYSICAL EXAMINATION: ECOG PERFORMANCE STATUS: 2 - Symptomatic, <50% confined to bed  BP 128/75 (BP Location: Left Arm, Patient Position: Sitting)   Pulse 72   Resp 18   Ht 5' 3"$  (1.6 m)   Wt 148 lb (67.1 kg)   SpO2 100%   BMI 26.22 kg/m   Filed Weights   12/22/22 1058  Weight: 148 lb (67.1 kg)    Physical Exam Vitals  and nursing note reviewed.  HENT:     Head: Normocephalic and atraumatic.     Mouth/Throat:     Pharynx: Oropharynx is clear.  Eyes:     Extraocular Movements: Extraocular movements intact.     Pupils: Pupils are equal, round, and reactive to light.  Cardiovascular:     Rate and Rhythm: Normal rate and regular rhythm.  Pulmonary:     Comments: Decreased breath sounds bilaterally.  Abdominal:     Palpations: Abdomen is soft.  Musculoskeletal:        General: Normal range of motion.     Cervical back: Normal range of motion.  Skin:    General: Skin is warm.  Neurological:     General: No focal deficit present.     Mental Status: She is alert and oriented to person, place, and time.  Psychiatric:        Behavior: Behavior normal.        Judgment: Judgment normal.        LABORATORY DATA:  I have reviewed the data as listed    Component Value Date/Time   NA 141 12/22/2022 1038   NA 142 03/02/2014 0810   K 4.5 12/22/2022 1038   K 3.9 03/02/2014 0810   CL 108 12/22/2022 1038   CL 109 (H) 03/02/2014 0810   CO2 27 12/22/2022 1038   CO2 22 03/02/2014 0810   GLUCOSE 96 12/22/2022 1038   GLUCOSE 103 (H) 03/02/2014 0810   BUN 10 12/22/2022 1038   BUN 14 03/02/2014 0810   CREATININE 0.86 12/22/2022 1038   CREATININE 0.68 03/02/2014 0810   CALCIUM 9.2 12/22/2022 1038   CALCIUM 9.4 03/02/2014 0810   PROT 6.6 12/22/2022 1038   PROT 7.7 03/02/2014 0810   ALBUMIN 4.0 12/22/2022 1038   ALBUMIN 4.0 03/02/2014 0810   AST 19 12/22/2022 1038   AST 27 03/02/2014 0810   ALT 13 12/22/2022 1038   ALT 28 03/02/2014 0810   ALKPHOS 63 12/22/2022 1038   ALKPHOS 77 03/02/2014 0810   BILITOT 1.4 (H) 12/22/2022 1038   BILITOT 1.5 (H) 03/02/2014 0810   GFRNONAA >60 12/22/2022 1038   GFRNONAA >60 03/02/2014 0810   GFRAA >60 03/02/2014 0810    No results found for: "SPEP", "UPEP"  Lab Results  Component Value Date   WBC 4.0 12/22/2022   NEUTROABS 2.3 12/22/2022   HGB 13.7  12/22/2022   HCT 42.0 12/22/2022   MCV 89.0 12/22/2022   PLT 154 12/22/2022      Chemistry      Component Value Date/Time   NA 141 12/22/2022 1038   NA 142 03/02/2014 0810   K 4.5 12/22/2022 1038   K 3.9 03/02/2014 0810   CL 108 12/22/2022 1038   CL 109 (H) 03/02/2014 0810   CO2 27 12/22/2022 1038   CO2 22 03/02/2014 0810   BUN 10 12/22/2022 1038   BUN 14  03/02/2014 0810   CREATININE 0.86 12/22/2022 1038   CREATININE 0.68 03/02/2014 0810      Component Value Date/Time   CALCIUM 9.2 12/22/2022 1038   CALCIUM 9.4 03/02/2014 0810   ALKPHOS 63 12/22/2022 1038   ALKPHOS 77 03/02/2014 0810   AST 19 12/22/2022 1038   AST 27 03/02/2014 0810   ALT 13 12/22/2022 1038   ALT 28 03/02/2014 0810   BILITOT 1.4 (H) 12/22/2022 1038   BILITOT 1.5 (H) 03/02/2014 0810       RADIOGRAPHIC STUDIES: I have personally reviewed the radiological images as listed and agreed with the findings in the report. No results found.   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  B12 low at <  50 [JAN 2023; PCP].   # Stressed the importance of monthly B12 injections.  Patient's hemoglobin today is 13.4- Patient will need lifelong B12 injections.   # GI evaluation- 09/2022 Dr.Anna re: pernicous anemi/need for EGD; pt declined EGD.   # DISPOSITION: # B12 injection today # Monthly B12 injection x 6 # follow up in 6 months-MD; labs- cbc/bmp; B12 level- B12 injection---Dr.B    No orders of the defined types were placed in this encounter.  All questions were answered. The patient knows to call the clinic with any problems, questions or concerns.      Cammie Sickle, MD 12/22/2022 11:08 AM

## 2022-12-22 NOTE — Progress Notes (Signed)
Patient has no concerns today,

## 2022-12-22 NOTE — Assessment & Plan Note (Addendum)
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  B12 low at <  50 [JAN 2023; PCP].   # Stressed the importance of monthly B12 injections.  Patient's hemoglobin today is 13.4- Patient will need lifelong B12 injections.   # GI evaluation- 09/2022 Dr.Anna re: pernicous anemi/need for EGD; pt declined EGD.   # DISPOSITION: # B12 injection today # Monthly B12 injection x 6 # follow up in 6 months-MD; labs- cbc/bmp; B12 level- B12 injection---Dr.B

## 2022-12-24 ENCOUNTER — Other Ambulatory Visit: Payer: Self-pay | Admitting: Family Medicine

## 2022-12-24 DIAGNOSIS — Z1231 Encounter for screening mammogram for malignant neoplasm of breast: Secondary | ICD-10-CM

## 2023-01-20 ENCOUNTER — Inpatient Hospital Stay: Payer: PPO | Attending: Internal Medicine

## 2023-01-20 DIAGNOSIS — E538 Deficiency of other specified B group vitamins: Secondary | ICD-10-CM

## 2023-01-20 DIAGNOSIS — D649 Anemia, unspecified: Secondary | ICD-10-CM

## 2023-01-20 DIAGNOSIS — Z79899 Other long term (current) drug therapy: Secondary | ICD-10-CM | POA: Insufficient documentation

## 2023-01-20 DIAGNOSIS — D510 Vitamin B12 deficiency anemia due to intrinsic factor deficiency: Secondary | ICD-10-CM | POA: Diagnosis not present

## 2023-01-20 MED ORDER — CYANOCOBALAMIN 1000 MCG/ML IJ SOLN
1000.0000 ug | Freq: Once | INTRAMUSCULAR | Status: AC
  Administered 2023-01-20: 1000 ug via INTRAMUSCULAR
  Filled 2023-01-20: qty 1

## 2023-01-25 ENCOUNTER — Ambulatory Visit: Payer: PPO | Admitting: Dermatology

## 2023-01-25 VITALS — BP 133/82 | HR 73

## 2023-01-25 DIAGNOSIS — L578 Other skin changes due to chronic exposure to nonionizing radiation: Secondary | ICD-10-CM | POA: Diagnosis not present

## 2023-01-25 DIAGNOSIS — Z7189 Other specified counseling: Secondary | ICD-10-CM | POA: Diagnosis not present

## 2023-01-25 DIAGNOSIS — Z79899 Other long term (current) drug therapy: Secondary | ICD-10-CM | POA: Diagnosis not present

## 2023-01-25 DIAGNOSIS — L570 Actinic keratosis: Secondary | ICD-10-CM | POA: Diagnosis not present

## 2023-01-25 DIAGNOSIS — L209 Atopic dermatitis, unspecified: Secondary | ICD-10-CM

## 2023-01-25 MED ORDER — MOMETASONE FUROATE 0.1 % EX CREA: TOPICAL_CREAM | CUTANEOUS | 0 refills | Status: AC

## 2023-01-25 NOTE — Progress Notes (Unsigned)
Follow-Up Visit   Subjective  Martha [name] is a 77 y.o. female who presents for the following: 6 weeks f/u on dermatitis on her body treating with Mometasone cream with a good response. Recheck precancer treated with LN2 on the left cheek.  The following portions of the chart were reviewed this encounter and updated as appropriate: medications, allergies, medical history  Review of Systems:  No other skin or systemic complaints except as noted in HPI or Assessment and Plan.  Objective  Well appearing patient in no apparent distress; mood and affect are within normal limits.  A focused examination was performed of the following areas: face,b/l arms, back, chest, abdomen , posterior neck     Assessment & Plan   ATOPIC DERMATITIS Exam: Scaly pink papules coalescing to plaques Location: b/l arms, back, chest, abdomen , posterior neck   Well controlled  Atopic dermatitis (eczema) is a chronic, relapsing, pruritic condition that can significantly affect quality of life. It is often associated with allergic rhinitis and/or asthma and can require treatment with topical medications, phototherapy, or in severe cases biologic injectable medication (Dupixent; Adbry) or Oral JAK inhibitors.  Treatment Plan:  Continue Dove soap Continue  cerave cream for daily moisturizer Continue mometasone 0.1 % cream - qd prn flares   Topical steroids (such as triamcinolone, fluocinolone, fluocinonide, mometasone, clobetasol, halobetasol, betamethasone, hydrocortisone) can cause thinning and lightening of the skin if they are used for too long in the same area. Your physician has selected the right strength medicine for your problem and area affected on the body. Please use your medication only as directed by your physician to prevent side effects.   Recommend gentle skin care.   Atopic dermatitis, unspecified type  Related Medications mometasone (ELOCON) 0.1 % cream Apply topically to aa body for  rash qd/bid prn flares  ACTINIC KERATOSIS  Actinic keratoses are precancerous spots that appear secondary to cumulative UV radiation exposure/sun exposure over time. They are chronic with expected duration over 1 year. A portion of actinic keratoses will progress to squamous cell carcinoma of the skin. It is not possible to reliably predict which spots will progress to skin cancer and so treatment is recommended to prevent development of skin cancer.  Recommend daily broad spectrum sunscreen SPF 30+ to sun-exposed areas, reapply every 2 hours as needed.  Recommend staying in the shade or wearing long sleeves, sun glasses (UVA+UVB protection) and wide brim hats (4-inch brim around the entire circumference of the hat). Call for new or changing lesions.  Treatment Plan: Destruction Procedure Note Destruction method: cryotherapy   Informed consent: discussed and consent obtained   Lesion destroyed using liquid nitrogen: Yes   Outcome: patient tolerated procedure well with no complications   Post-procedure details: wound care instructions given   Locations: left cheek  # of Lesions Treated: 1  Prior to procedure, discussed risks of blister formation, small wound, skin dyspigmentation, or rare scar following cryotherapy. Recommend Vaseline ointment to treated areas while healing.    If not gone in 2 months return to the office   Actinic Damage - chronic, secondary to cumulative UV radiation exposure/sun exposure over time - diffuse scaly erythematous macules with underlying dyspigmentation - Recommend daily broad spectrum sunscreen SPF 30+ to sun-exposed areas, reapply every 2 hours as needed.  - Recommend staying in the shade or wearing long sleeves, sun glasses (UVA+UVB protection) and wide brim hats (4-inch brim around the entire circumference of the hat). - Call for new or changing  lesions.   Return if symptoms worsen or fail to improve.  IMarye Round, CMA, am acting as scribe for  Sarina Ser, MD .   Documentation: I have reviewed the above documentation for accuracy and completeness, and I agree with the above.  Sarina Ser, MD

## 2023-01-25 NOTE — Patient Instructions (Addendum)
Cryotherapy Aftercare  Wash gently with soap and water everyday.   Apply Vaseline and Band-Aid daily until healed.     Due to recent changes in healthcare laws, you may see results of your pathology and/or laboratory studies on MyChart before the doctors have had a chance to review them. We understand that in some cases there may be results that are confusing or concerning to you. Please understand that not all results are received at the same time and often the doctors may need to interpret multiple results in order to provide you with the best plan of care or course of treatment. Therefore, we ask that you please give us 2 business days to thoroughly review all your results before contacting the office for clarification. Should we see a critical lab result, you will be contacted sooner.   If You Need Anything After Your Visit  If you have any questions or concerns for your doctor, please call our main line at [phone] and press option 4 to reach your doctor's medical assistant. If no one answers, please leave a voicemail as directed and we will return your call as soon as possible. Messages left after 4 pm will be answered the following business day.   You may also send us a message via MyChart. We typically respond to MyChart messages within 1-2 business days.  For prescription refills, please ask your pharmacy to contact our office. Our fax number is [phone].  If you have an urgent issue when the clinic is closed that cannot wait until the next business day, you can page your doctor at the number below.    Please note that while we do our best to be available for urgent issues outside of office hours, we are not available 24/7.   If you have an urgent issue and are unable to reach us, you may choose to seek medical care at your doctor's office, retail clinic, urgent care center, or emergency room.  If you have a medical emergency, please immediately call 911 or go to the  emergency department.  Pager Numbers  - Dr. Kowalski: [phone]  - Dr. Moye: [phone]  - Dr. Stewart: [phone]  In the event of inclement weather, please call our main line at [phone] for an update on the status of any delays or closures.  Dermatology Medication Tips: Please keep the boxes that topical medications come in in order to help keep track of the instructions about where and how to use these. Pharmacies typically print the medication instructions only on the boxes and not directly on the medication tubes.   If your medication is too expensive, please contact our office at [phone] option 4 or send us a message through MyChart.   We are unable to tell what your co-pay for medications will be in advance as this is different depending on your insurance coverage. However, we may be able to find a substitute medication at lower cost or fill out paperwork to get insurance to cover a needed medication.   If a prior authorization is required to get your medication covered by your insurance company, please allow us 1-2 business days to complete this process.  Drug prices often vary depending on where the prescription is filled and some pharmacies may offer cheaper prices.  The website www.goodrx.com contains coupons for medications through different pharmacies. The prices here do not account for what the cost may be with help from insurance (it may be cheaper with your insurance), but the website can   give you the price if you did not use any insurance.  - You can print the associated coupon and take it with your prescription to the pharmacy.  - You may also stop by our office during regular business hours and pick up a GoodRx coupon card.  - If you need your prescription sent electronically to a different pharmacy, notify our office through [facility] MyChart or by phone at [phone] option 4.     Si Usted Necesita Algo Despus de Su Visita  Tambin puede  enviarnos un mensaje a travs de MyChart. Por lo general respondemos a los mensajes de MyChart en el transcurso de 1 a 2 das hbiles.  Para renovar recetas, por favor pida a su farmacia que se ponga en contacto con nuestra oficina. Nuestro nmero de fax es el [phone].  Si tiene un asunto urgente cuando la clnica est cerrada y que no puede esperar hasta el siguiente da hbil, puede llamar/localizar a su doctor(a) al nmero que aparece a continuacin.   Por favor, tenga en cuenta que aunque hacemos todo lo posible para estar disponibles para asuntos urgentes fuera del horario de oficina, no estamos disponibles las 24 horas del da, los 7 das de la semana.   Si tiene un problema urgente y no puede comunicarse con nosotros, puede optar por buscar atencin mdica  en el consultorio de su doctor(a), en una clnica privada, en un centro de atencin urgente o en una sala de emergencias.  Si tiene una emergencia mdica, por favor llame inmediatamente al 911 o vaya a la sala de emergencias.  Nmeros de bper  - Dr. Kowalski: [phone]  - Dra. Moye: [phone]  - Dra. Stewart: [phone]  En caso de inclemencias del tiempo, por favor llame a nuestra lnea principal al [phone] para una actualizacin sobre el estado de cualquier retraso o cierre.  Consejos para la medicacin en dermatologa: Por favor, guarde las cajas en las que vienen los medicamentos de uso tpico para ayudarle a seguir las instrucciones sobre dnde y cmo usarlos. Las farmacias generalmente imprimen las instrucciones del medicamento slo en las cajas y no directamente en los tubos del medicamento.   Si su medicamento es muy caro, por favor, pngase en contacto con nuestra oficina llamando al [phone] y presione la opcin 4 o envenos un mensaje a travs de MyChart.   No podemos decirle cul ser su copago por los medicamentos por adelantado ya que esto es diferente dependiendo de la cobertura de su seguro.  Sin embargo, es posible que podamos encontrar un medicamento sustituto a menor costo o llenar un formulario para que el seguro cubra el medicamento que se considera necesario.   Si se requiere una autorizacin previa para que su compaa de seguros cubra su medicamento, por favor permtanos de 1 a 2 das hbiles para completar este proceso.  Los precios de los medicamentos varan con frecuencia dependiendo del lugar de dnde se surte la receta y alguna farmacias pueden ofrecer precios ms baratos.  El sitio web www.goodrx.com tiene cupones para medicamentos de diferentes farmacias. Los precios aqu no tienen en cuenta lo que podra costar con la ayuda del seguro (puede ser ms barato con su seguro), pero el sitio web puede darle el precio si no utiliz ningn seguro.  - Puede imprimir el cupn correspondiente y llevarlo con su receta a la farmacia.  - Tambin puede pasar por nuestra oficina durante el horario de atencin regular y recoger una tarjeta de cupones de GoodRx.  -   Si necesita que su receta se enve electrnicamente a una farmacia diferente, informe a nuestra oficina a travs de MyChart de [place] o por telfono llamando al [phone] y presione la opcin 4.  

## 2023-01-26 ENCOUNTER — Encounter: Payer: Self-pay | Admitting: Dermatology

## 2023-01-27 DIAGNOSIS — M1712 Unilateral primary osteoarthritis, left knee: Secondary | ICD-10-CM | POA: Diagnosis not present

## 2023-01-27 DIAGNOSIS — M25562 Pain in left knee: Secondary | ICD-10-CM | POA: Diagnosis not present

## 2023-02-19 ENCOUNTER — Inpatient Hospital Stay: Payer: PPO | Attending: Internal Medicine

## 2023-02-19 DIAGNOSIS — D510 Vitamin B12 deficiency anemia due to intrinsic factor deficiency: Secondary | ICD-10-CM | POA: Diagnosis not present

## 2023-02-19 DIAGNOSIS — D649 Anemia, unspecified: Secondary | ICD-10-CM

## 2023-02-19 DIAGNOSIS — E538 Deficiency of other specified B group vitamins: Secondary | ICD-10-CM

## 2023-02-19 MED ORDER — CYANOCOBALAMIN 1000 MCG/ML IJ SOLN
1000.0000 ug | Freq: Once | INTRAMUSCULAR | Status: AC
  Administered 2023-02-19: 1000 ug via INTRAMUSCULAR
  Filled 2023-02-19: qty 1

## 2023-02-25 ENCOUNTER — Ambulatory Visit
Admission: RE | Admit: 2023-02-25 | Discharge: 2023-02-25 | Disposition: A | Discharge status: Home / Self Care | Payer: PPO | Source: Ambulatory Visit | Attending: Family Medicine | Admitting: Family Medicine

## 2023-02-25 DIAGNOSIS — Z1231 Encounter for screening mammogram for malignant neoplasm of breast: Secondary | ICD-10-CM | POA: Insufficient documentation

## 2023-03-22 ENCOUNTER — Inpatient Hospital Stay: Payer: PPO | Attending: Internal Medicine

## 2023-03-22 DIAGNOSIS — D510 Vitamin B12 deficiency anemia due to intrinsic factor deficiency: Secondary | ICD-10-CM | POA: Insufficient documentation

## 2023-03-22 DIAGNOSIS — E538 Deficiency of other specified B group vitamins: Secondary | ICD-10-CM

## 2023-03-22 DIAGNOSIS — D649 Anemia, unspecified: Secondary | ICD-10-CM

## 2023-03-22 MED ORDER — CYANOCOBALAMIN 1000 MCG/ML IJ SOLN
1000.0000 ug | Freq: Once | INTRAMUSCULAR | Status: AC
  Administered 2023-03-22: 1000 ug via INTRAMUSCULAR
  Filled 2023-03-22: qty 1

## 2023-03-23 DIAGNOSIS — D510 Vitamin B12 deficiency anemia due to intrinsic factor deficiency: Secondary | ICD-10-CM | POA: Diagnosis not present

## 2023-03-23 DIAGNOSIS — E785 Hyperlipidemia, unspecified: Secondary | ICD-10-CM | POA: Diagnosis not present

## 2023-03-23 DIAGNOSIS — Z0000 Encounter for general adult medical examination without abnormal findings: Secondary | ICD-10-CM | POA: Diagnosis not present

## 2023-03-30 DIAGNOSIS — Z1331 Encounter for screening for depression: Secondary | ICD-10-CM | POA: Diagnosis not present

## 2023-03-30 DIAGNOSIS — E785 Hyperlipidemia, unspecified: Secondary | ICD-10-CM | POA: Diagnosis not present

## 2023-03-30 DIAGNOSIS — D510 Vitamin B12 deficiency anemia due to intrinsic factor deficiency: Secondary | ICD-10-CM | POA: Diagnosis not present

## 2023-03-30 DIAGNOSIS — Z0000 Encounter for general adult medical examination without abnormal findings: Secondary | ICD-10-CM | POA: Diagnosis not present

## 2023-04-22 ENCOUNTER — Inpatient Hospital Stay: Payer: PPO | Attending: Internal Medicine

## 2023-04-22 DIAGNOSIS — D510 Vitamin B12 deficiency anemia due to intrinsic factor deficiency: Secondary | ICD-10-CM | POA: Insufficient documentation

## 2023-04-22 DIAGNOSIS — D649 Anemia, unspecified: Secondary | ICD-10-CM

## 2023-04-22 DIAGNOSIS — E538 Deficiency of other specified B group vitamins: Secondary | ICD-10-CM

## 2023-04-22 MED ORDER — CYANOCOBALAMIN 1000 MCG/ML IJ SOLN
1000.0000 ug | Freq: Once | INTRAMUSCULAR | Status: AC
  Administered 2023-04-22: 1000 ug via INTRAMUSCULAR
  Filled 2023-04-22: qty 1

## 2023-05-21 ENCOUNTER — Inpatient Hospital Stay: Payer: PPO | Attending: Internal Medicine

## 2023-05-21 DIAGNOSIS — D510 Vitamin B12 deficiency anemia due to intrinsic factor deficiency: Secondary | ICD-10-CM | POA: Diagnosis not present

## 2023-05-21 DIAGNOSIS — Z79899 Other long term (current) drug therapy: Secondary | ICD-10-CM | POA: Insufficient documentation

## 2023-05-21 DIAGNOSIS — D649 Anemia, unspecified: Secondary | ICD-10-CM

## 2023-05-21 DIAGNOSIS — E538 Deficiency of other specified B group vitamins: Secondary | ICD-10-CM

## 2023-05-21 MED ORDER — CYANOCOBALAMIN 1000 MCG/ML IJ SOLN
1000.0000 ug | Freq: Once | INTRAMUSCULAR | Status: AC
  Administered 2023-05-21: 1000 ug via INTRAMUSCULAR

## 2023-06-22 ENCOUNTER — Inpatient Hospital Stay: Payer: PPO

## 2023-06-22 ENCOUNTER — Inpatient Hospital Stay: Payer: PPO | Admitting: Internal Medicine

## 2023-06-24 ENCOUNTER — Encounter: Payer: Self-pay | Admitting: Internal Medicine

## 2023-06-24 ENCOUNTER — Inpatient Hospital Stay: Payer: PPO | Admitting: Internal Medicine

## 2023-06-24 ENCOUNTER — Inpatient Hospital Stay: Payer: PPO

## 2023-06-24 ENCOUNTER — Inpatient Hospital Stay: Payer: PPO | Attending: Internal Medicine

## 2023-06-24 VITALS — BP 123/71 | HR 72 | Temp 97.7°F | Ht 63.0 in | Wt 146.2 lb

## 2023-06-24 DIAGNOSIS — Z79899 Other long term (current) drug therapy: Secondary | ICD-10-CM | POA: Diagnosis not present

## 2023-06-24 DIAGNOSIS — D510 Vitamin B12 deficiency anemia due to intrinsic factor deficiency: Secondary | ICD-10-CM | POA: Diagnosis not present

## 2023-06-24 DIAGNOSIS — E538 Deficiency of other specified B group vitamins: Secondary | ICD-10-CM

## 2023-06-24 DIAGNOSIS — D649 Anemia, unspecified: Secondary | ICD-10-CM

## 2023-06-24 LAB — CBC WITH DIFFERENTIAL (CANCER CENTER ONLY)
Abs Immature Granulocytes: 0.01 10*3/uL (ref 0.00–0.07)
Basophils Absolute: 0 10*3/uL (ref 0.0–0.1)
Basophils Relative: 0 %
Eosinophils Absolute: 0.1 10*3/uL (ref 0.0–0.5)
Eosinophils Relative: 2 %
HCT: 41 % (ref 36.0–46.0)
Hemoglobin: 13.4 g/dL (ref 12.0–15.0)
Immature Granulocytes: 0 %
Lymphocytes Relative: 27 %
Lymphs Abs: 1.2 10*3/uL (ref 0.7–4.0)
MCH: 29.7 pg (ref 26.0–34.0)
MCHC: 32.7 g/dL (ref 30.0–36.0)
MCV: 90.9 fL (ref 80.0–100.0)
Monocytes Absolute: 0.4 10*3/uL (ref 0.1–1.0)
Monocytes Relative: 9 %
Neutro Abs: 2.7 10*3/uL (ref 1.7–7.7)
Neutrophils Relative %: 62 %
Platelet Count: 151 10*3/uL (ref 150–400)
RBC: 4.51 MIL/uL (ref 3.87–5.11)
RDW: 13.1 % (ref 11.5–15.5)
WBC Count: 4.5 10*3/uL (ref 4.0–10.5)
nRBC: 0 % (ref 0.0–0.2)

## 2023-06-24 LAB — BASIC METABOLIC PANEL - CANCER CENTER ONLY
Anion gap: 4 — ABNORMAL LOW (ref 5–15)
BUN: 14 mg/dL (ref 8–23)
CO2: 26 mmol/L (ref 22–32)
Calcium: 9 mg/dL (ref 8.9–10.3)
Chloride: 108 mmol/L (ref 98–111)
Creatinine: 0.77 mg/dL (ref 0.44–1.00)
GFR, Estimated: >60 mL/min (ref >60)
Glucose, Bld: 80 mg/dL (ref 70–99)
Potassium: 4.5 mmol/L (ref 3.5–5.1)
Sodium: 138 mmol/L (ref 135–145)

## 2023-06-24 LAB — VITAMIN B12: Vitamin B-12: 172 pg/mL — ABNORMAL LOW (ref 180–914)

## 2023-06-24 MED ORDER — CYANOCOBALAMIN 1000 MCG/ML IJ SOLN
1000.0000 ug | Freq: Once | INTRAMUSCULAR | Status: AC
  Administered 2023-06-24: 1000 ug via INTRAMUSCULAR

## 2023-06-24 NOTE — Progress Notes (Signed)
[facility] OFFICE PROGRESS NOTE  Patient Care Team: Jerl Mina, MD as PCP - General (Family Medicine) Earna Coder, MD as Consulting Physician (Oncology)   Cancer Staging  No matching staging information was found for the patient.    Oncology History   No history exists.   #PERNICIOUS anemia-severe B12 deficiency/moderate anemia hemoglobin 9; WBC 2.3; platelets 123-MCV 120.  B12 low at <  50 [JAN 2023; PCP].  Positive for antiparietal antibodies/intrinsic factor antibody; elevated methylmalonic acid; high LDH/low haptoglobin.  B12 injections-  # GI evaluation [Dr.Anna]March 2023   HISTORY OF PRESENT ILLNESS: Ambulating independently.  Alone.   Martha [name] 77 y.o.  female pleasant pernicious anemia/severe B12 deficiency is here for follow-up.  Patient since starting on B12 injections.  Significant improvement of energy levels.  Denies any worsening shortness of breath or cough.  Review of Systems  Constitutional:  Negative for chills, diaphoresis and fever.  HENT:  Negative for nosebleeds and sore throat.   Eyes:  Negative for double vision.  Respiratory:  Negative for cough, hemoptysis, sputum production and wheezing.   Cardiovascular:  Negative for chest pain, palpitations, orthopnea and leg swelling.  Gastrointestinal:  Negative for abdominal pain, blood in stool, constipation, diarrhea, heartburn, melena, nausea and vomiting.  Genitourinary:  Negative for dysuria, frequency and urgency.  Musculoskeletal:  Negative for back pain and joint pain.  Skin: Negative.  Negative for itching and rash.  Neurological:  Negative for tingling, focal weakness, weakness and headaches.  Endo/Heme/Allergies:  Does not bruise/bleed easily.  Psychiatric/Behavioral:  Negative for depression. The patient is not nervous/anxious and does not have insomnia.       PAST MEDICAL HISTORY :  Past Medical History:  Diagnosis Date   Anemia    Hemorrhoids during  pregnancy    Hives     PAST SURGICAL HISTORY :   Past Surgical History:  Procedure Laterality Date   COLONOSCOPY     COLONOSCOPY WITH PROPOFOL N/A 03/30/2018   Procedure: COLONOSCOPY WITH PROPOFOL;  Surgeon: Scot Jun, MD;  Location: [facility] ENDOSCOPY;  Service: Endoscopy;  Laterality: N/A;   DILATION AND CURETTAGE OF UTERUS      FAMILY HISTORY :   Family History  Problem Relation Age of Onset   Prostate cancer Father    Heart disease Father    Breast cancer Neg Hx     SOCIAL HISTORY:   Social History   Tobacco Use   Smoking status: Never   Smokeless tobacco: Never  Vaping Use   Vaping status: Never Used  Substance Use Topics   Alcohol use: Not Currently    Alcohol/week: 6.0 standard drinks of alcohol    Types: 6 Cans of beer per week   Drug use: Never    ALLERGIES:  has No Known Allergies.  MEDICATIONS:  Current Outpatient Medications  Medication Sig Dispense Refill   lovastatin (MEVACOR) 40 MG tablet Take 40 mg by mouth at bedtime.     mometasone (ELOCON) 0.1 % cream Apply topically to aa body for rash qd/bid prn flares 45 g 0   cetirizine (ZYRTEC) 10 MG tablet Take 10 mg by mouth daily. (Patient not taking: Reported on 12/21/2022)     furosemide (LASIX) 20 MG tablet Take 20 mg by mouth daily. (Patient not taking: Reported on 12/21/2022)     montelukast (SINGULAIR) 10 MG tablet Take 10 mg by mouth daily. (Patient not taking: Reported on 12/21/2022)     No current facility-administered medications for this  visit.    PHYSICAL EXAMINATION: ECOG PERFORMANCE STATUS: 2 - Symptomatic, <50% confined to bed  BP 123/71 (BP Location: Left Arm, Patient Position: Sitting, Cuff Size: Normal)   Pulse 72   Temp 97.7 F (36.5 C) (Tympanic)   Ht 5\' 3"  (1.6 m)   Wt 146 lb 3.2 oz (66.3 kg)   SpO2 100%   BMI 25.90 kg/m   Filed Weights   06/24/23 0851  Weight: 146 lb 3.2 oz (66.3 kg)     Physical Exam Vitals and nursing note reviewed.  HENT:     Head:  Normocephalic and atraumatic.     Mouth/Throat:     Pharynx: Oropharynx is clear.  Eyes:     Extraocular Movements: Extraocular movements intact.     Pupils: Pupils are equal, round, and reactive to light.  Cardiovascular:     Rate and Rhythm: Normal rate and regular rhythm.  Pulmonary:     Comments: Decreased breath sounds bilaterally.  Abdominal:     Palpations: Abdomen is soft.  Musculoskeletal:        General: Normal range of motion.     Cervical back: Normal range of motion.  Skin:    General: Skin is warm.  Neurological:     General: No focal deficit present.     Mental Status: She is alert and oriented to person, place, and time.  Psychiatric:        Behavior: Behavior normal.        Judgment: Judgment normal.        LABORATORY DATA:  I have reviewed the data as listed    Component Value Date/Time   NA 138 06/24/2023 0853   NA 142 03/02/2014 0810   K 4.5 06/24/2023 0853   K 3.9 03/02/2014 0810   CL 108 06/24/2023 0853   CL 109 (H) 03/02/2014 0810   CO2 26 06/24/2023 0853   CO2 22 03/02/2014 0810   GLUCOSE 80 06/24/2023 0853   GLUCOSE 103 (H) 03/02/2014 0810   BUN 14 06/24/2023 0853   BUN 14 03/02/2014 0810   CREATININE 0.77 06/24/2023 0853   CREATININE 0.68 03/02/2014 0810   CALCIUM 9.0 06/24/2023 0853   CALCIUM 9.4 03/02/2014 0810   PROT 6.6 12/22/2022 1038   PROT 7.7 03/02/2014 0810   ALBUMIN 4.0 12/22/2022 1038   ALBUMIN 4.0 03/02/2014 0810   AST 19 12/22/2022 1038   AST 27 03/02/2014 0810   ALT 13 12/22/2022 1038   ALT 28 03/02/2014 0810   ALKPHOS 63 12/22/2022 1038   ALKPHOS 77 03/02/2014 0810   BILITOT 1.4 (H) 12/22/2022 1038   BILITOT 1.5 (H) 03/02/2014 0810   GFRNONAA >60 06/24/2023 0853   GFRNONAA >60 03/02/2014 0810   GFRAA >60 03/02/2014 0810    No results found for: "SPEP", "UPEP"  Lab Results  Component Value Date   WBC 4.5 06/24/2023   NEUTROABS 2.7 06/24/2023   HGB 13.4 06/24/2023   HCT 41.0 06/24/2023   MCV 90.9  06/24/2023   PLT 151 06/24/2023      Chemistry      Component Value Date/Time   NA 138 06/24/2023 0853   NA 142 03/02/2014 0810   K 4.5 06/24/2023 0853   K 3.9 03/02/2014 0810   CL 108 06/24/2023 0853   CL 109 (H) 03/02/2014 0810   CO2 26 06/24/2023 0853   CO2 22 03/02/2014 0810   BUN 14 06/24/2023 0853   BUN 14 03/02/2014 0810   CREATININE 0.77 06/24/2023 0853  CREATININE 0.68 03/02/2014 0810      Component Value Date/Time   CALCIUM 9.0 06/24/2023 0853   CALCIUM 9.4 03/02/2014 0810   ALKPHOS 63 12/22/2022 1038   ALKPHOS 77 03/02/2014 0810   AST 19 12/22/2022 1038   AST 27 03/02/2014 0810   ALT 13 12/22/2022 1038   ALT 28 03/02/2014 0810   BILITOT 1.4 (H) 12/22/2022 1038   BILITOT 1.5 (H) 03/02/2014 0810       RADIOGRAPHIC STUDIES: I have personally reviewed the radiological images as listed and agreed with the findings in the report. No results found.   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  B12 low at <  50 [JAN 2023; PCP].   # Stressed the importance of monthly B12 injections.  Patient's hemoglobin today is 13.4- Patient will need lifelong B12 injections.   # GI evaluation- 09/2022 Dr.Anna re: pernicous anemi/need for EGD; pt declined EGD.   # DISPOSITION: # B12 injection today # Monthly B12 injection x 6 # follow up in 6 months-MD; labs- cbc/bmp; B12 level- B12 injection---Dr.B    No orders of the defined types were placed in this encounter.  All questions were answered. The patient knows to call the clinic with any problems, questions or concerns.      Earna Coder, MD 06/24/2023 9:18 AM

## 2023-06-24 NOTE — Progress Notes (Signed)
Some fatigue. No questions or concerns today.

## 2023-06-24 NOTE — Assessment & Plan Note (Signed)
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  B12 low at <  50 [JAN 2023; PCP].   # Stressed the importance of monthly B12 injections.  Patient's hemoglobin today is 13.4- Patient will need lifelong B12 injections.   # GI evaluation- 09/2022 Dr.Anna re: pernicous anemi/need for EGD; pt declined EGD.   # DISPOSITION: # B12 injection today # Monthly B12 injection x 6 # follow up in 6 months-MD; labs- cbc/bmp; B12 level- B12 injection---Dr.B

## 2023-07-26 ENCOUNTER — Inpatient Hospital Stay: Payer: PPO | Attending: Internal Medicine

## 2023-07-26 DIAGNOSIS — E538 Deficiency of other specified B group vitamins: Secondary | ICD-10-CM

## 2023-07-26 DIAGNOSIS — Z79899 Other long term (current) drug therapy: Secondary | ICD-10-CM | POA: Diagnosis not present

## 2023-07-26 DIAGNOSIS — D510 Vitamin B12 deficiency anemia due to intrinsic factor deficiency: Secondary | ICD-10-CM | POA: Diagnosis not present

## 2023-07-26 DIAGNOSIS — D649 Anemia, unspecified: Secondary | ICD-10-CM

## 2023-07-26 MED ORDER — CYANOCOBALAMIN 1000 MCG/ML IJ SOLN
1000.0000 ug | Freq: Once | INTRAMUSCULAR | Status: AC
  Administered 2023-07-26: 1000 ug via INTRAMUSCULAR
  Filled 2023-07-26: qty 1

## 2023-08-20 ENCOUNTER — Inpatient Hospital Stay: Payer: PPO | Attending: Internal Medicine

## 2023-08-20 DIAGNOSIS — E538 Deficiency of other specified B group vitamins: Secondary | ICD-10-CM

## 2023-08-20 DIAGNOSIS — D510 Vitamin B12 deficiency anemia due to intrinsic factor deficiency: Secondary | ICD-10-CM | POA: Diagnosis not present

## 2023-08-20 DIAGNOSIS — D649 Anemia, unspecified: Secondary | ICD-10-CM

## 2023-08-20 MED ORDER — CYANOCOBALAMIN 1000 MCG/ML IJ SOLN
1000.0000 ug | Freq: Once | INTRAMUSCULAR | Status: AC
  Administered 2023-08-20: 1000 ug via INTRAMUSCULAR
  Filled 2023-08-20: qty 1

## 2023-08-25 ENCOUNTER — Inpatient Hospital Stay: Payer: PPO

## 2023-09-03 DIAGNOSIS — Z23 Encounter for immunization: Secondary | ICD-10-CM | POA: Diagnosis not present

## 2023-09-20 ENCOUNTER — Inpatient Hospital Stay: Payer: PPO | Attending: Internal Medicine

## 2023-09-20 DIAGNOSIS — D510 Vitamin B12 deficiency anemia due to intrinsic factor deficiency: Secondary | ICD-10-CM | POA: Insufficient documentation

## 2023-09-20 DIAGNOSIS — E538 Deficiency of other specified B group vitamins: Secondary | ICD-10-CM

## 2023-09-20 DIAGNOSIS — D649 Anemia, unspecified: Secondary | ICD-10-CM

## 2023-09-20 MED ORDER — CYANOCOBALAMIN 1000 MCG/ML IJ SOLN
1000.0000 ug | Freq: Once | INTRAMUSCULAR | Status: AC
  Administered 2023-09-20: 1000 ug via INTRAMUSCULAR
  Filled 2023-09-20: qty 1

## 2023-09-24 ENCOUNTER — Inpatient Hospital Stay: Payer: PPO

## 2023-10-20 ENCOUNTER — Inpatient Hospital Stay: Payer: PPO | Attending: Internal Medicine

## 2023-10-20 DIAGNOSIS — D649 Anemia, unspecified: Secondary | ICD-10-CM

## 2023-10-20 DIAGNOSIS — E538 Deficiency of other specified B group vitamins: Secondary | ICD-10-CM

## 2023-10-20 DIAGNOSIS — Z79899 Other long term (current) drug therapy: Secondary | ICD-10-CM | POA: Diagnosis not present

## 2023-10-20 DIAGNOSIS — D510 Vitamin B12 deficiency anemia due to intrinsic factor deficiency: Secondary | ICD-10-CM | POA: Diagnosis not present

## 2023-10-20 MED ORDER — CYANOCOBALAMIN 1000 MCG/ML IJ SOLN
1000.0000 ug | Freq: Once | INTRAMUSCULAR | Status: AC
  Administered 2023-10-20: 1000 ug via INTRAMUSCULAR
  Filled 2023-10-20: qty 1

## 2023-10-25 ENCOUNTER — Inpatient Hospital Stay: Payer: PPO

## 2023-11-24 ENCOUNTER — Inpatient Hospital Stay: Payer: PPO | Attending: Internal Medicine

## 2023-11-25 ENCOUNTER — Inpatient Hospital Stay: Payer: PPO

## 2023-11-26 ENCOUNTER — Inpatient Hospital Stay: Payer: PPO | Attending: Internal Medicine

## 2023-11-26 DIAGNOSIS — D510 Vitamin B12 deficiency anemia due to intrinsic factor deficiency: Secondary | ICD-10-CM | POA: Insufficient documentation

## 2023-11-26 DIAGNOSIS — D649 Anemia, unspecified: Secondary | ICD-10-CM

## 2023-11-26 DIAGNOSIS — E538 Deficiency of other specified B group vitamins: Secondary | ICD-10-CM

## 2023-11-26 MED ORDER — CYANOCOBALAMIN 1000 MCG/ML IJ SOLN
1000.0000 ug | Freq: Once | INTRAMUSCULAR | Status: AC
  Administered 2023-11-26: 1000 ug via INTRAMUSCULAR
  Filled 2023-11-26: qty 1

## 2023-12-24 ENCOUNTER — Inpatient Hospital Stay: Payer: PPO | Admitting: Internal Medicine

## 2023-12-24 ENCOUNTER — Inpatient Hospital Stay: Payer: PPO

## 2023-12-24 ENCOUNTER — Encounter: Payer: Self-pay | Admitting: Internal Medicine

## 2023-12-24 ENCOUNTER — Inpatient Hospital Stay: Payer: PPO | Attending: Internal Medicine

## 2023-12-24 VITALS — BP 133/79 | HR 73 | Temp 96.4°F | Ht 63.0 in | Wt 135.0 lb

## 2023-12-24 DIAGNOSIS — D649 Anemia, unspecified: Secondary | ICD-10-CM

## 2023-12-24 DIAGNOSIS — D510 Vitamin B12 deficiency anemia due to intrinsic factor deficiency: Secondary | ICD-10-CM | POA: Insufficient documentation

## 2023-12-24 DIAGNOSIS — E538 Deficiency of other specified B group vitamins: Secondary | ICD-10-CM

## 2023-12-24 LAB — BASIC METABOLIC PANEL
Anion gap: 7 (ref 5–15)
BUN: 14 mg/dL (ref 8–23)
CO2: 26 mmol/L (ref 22–32)
Calcium: 9 mg/dL (ref 8.9–10.3)
Chloride: 107 mmol/L (ref 98–111)
Creatinine, Ser: 0.63 mg/dL (ref 0.44–1.00)
GFR, Estimated: >60 mL/min (ref >60)
Glucose, Bld: 94 mg/dL (ref 70–99)
Potassium: 4.1 mmol/L (ref 3.5–5.1)
Sodium: 140 mmol/L (ref 135–145)

## 2023-12-24 LAB — CBC WITH DIFFERENTIAL (CANCER CENTER ONLY)
Abs Immature Granulocytes: 0.01 10*3/uL (ref 0.00–0.07)
Basophils Absolute: 0 10*3/uL (ref 0.0–0.1)
Basophils Relative: 1 %
Eosinophils Absolute: 0.1 10*3/uL (ref 0.0–0.5)
Eosinophils Relative: 3 %
HCT: 41.8 % (ref 36.0–46.0)
Hemoglobin: 14 g/dL (ref 12.0–15.0)
Immature Granulocytes: 0 %
Lymphocytes Relative: 26 %
Lymphs Abs: 1.1 10*3/uL (ref 0.7–4.0)
MCH: 31.3 pg (ref 26.0–34.0)
MCHC: 33.5 g/dL (ref 30.0–36.0)
MCV: 93.5 fL (ref 80.0–100.0)
Monocytes Absolute: 0.3 10*3/uL (ref 0.1–1.0)
Monocytes Relative: 7 %
Neutro Abs: 2.7 10*3/uL (ref 1.7–7.7)
Neutrophils Relative %: 63 %
Platelet Count: 146 10*3/uL — ABNORMAL LOW (ref 150–400)
RBC: 4.47 MIL/uL (ref 3.87–5.11)
RDW: 12.8 % (ref 11.5–15.5)
WBC Count: 4.2 10*3/uL (ref 4.0–10.5)
nRBC: 0 % (ref 0.0–0.2)

## 2023-12-24 LAB — VITAMIN B12: Vitamin B-12: 244 pg/mL (ref 180–914)

## 2023-12-24 MED ORDER — CYANOCOBALAMIN 1000 MCG/ML IJ SOLN
1000.0000 ug | Freq: Once | INTRAMUSCULAR | Status: AC
  Administered 2023-12-24: 1000 ug via INTRAMUSCULAR
  Filled 2023-12-24: qty 1

## 2023-12-24 NOTE — Progress Notes (Signed)
 Fatigue/weakness: NO Dyspena: NO  Light headedness: NO  Blood in stool: NO    Having trouble sleeping due to spouse passing last month. Together 58 yeas.

## 2023-12-24 NOTE — Assessment & Plan Note (Addendum)
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  B12 low at <  50 [JAN 2023; PCP]. Declined injections at home.   # Stressed the importance of monthly B12 injections.  Patient's hemoglobin today is 13.4- Patient will need lifelong B12 injections.   # GI evaluation- 09/2022 Dr.Anna re: pernicous anemi/need for EGD; pt declined EGD.   # DISPOSITION: # B12 injection today # Monthly B12 injection x 6 # follow up in 6 months-MD; labs- cbc/bmp; B12 level- B12 injection---Dr.B

## 2023-12-24 NOTE — Progress Notes (Signed)
 [facility] OFFICE PROGRESS NOTE  Patient Care Team: Jerl Mina, MD as PCP - General (Family Medicine) Earna Coder, MD as Consulting Physician (Oncology)   Cancer Staging  No matching staging information was found for the patient.   Oncology History   No history exists.   #PERNICIOUS anemia-severe B12 deficiency/moderate anemia hemoglobin 9; WBC 2.3; platelets 123-MCV 120.  B12 low at <  50 [JAN 2023; PCP].  Positive for antiparietal antibodies/intrinsic factor antibody; elevated methylmalonic acid; high LDH/low haptoglobin.  B12 injections-  # GI evaluation [Dr.Anna]March 2023   HISTORY OF PRESENT ILLNESS: Ambulating independently.  Alone.   Martha [name] 78 y.o.  female pleasant pernicious anemia/severe B12 deficiency is here for follow-up.  Patient's husband passed of stroke recently. She is coping fairly well.  Significant improvement of energy levels.  Denies any worsening shortness of breath or cough.  Review of Systems  Constitutional:  Negative for chills, diaphoresis and fever.  HENT:  Negative for nosebleeds and sore throat.   Eyes:  Negative for double vision.  Respiratory:  Negative for cough, hemoptysis, sputum production and wheezing.   Cardiovascular:  Negative for chest pain, palpitations, orthopnea and leg swelling.  Gastrointestinal:  Negative for abdominal pain, blood in stool, constipation, diarrhea, heartburn, melena, nausea and vomiting.  Genitourinary:  Negative for dysuria, frequency and urgency.  Musculoskeletal:  Negative for back pain and joint pain.  Skin: Negative.  Negative for itching and rash.  Neurological:  Negative for tingling, focal weakness, weakness and headaches.  Endo/Heme/Allergies:  Does not bruise/bleed easily.  Psychiatric/Behavioral:  Negative for depression. The patient is not nervous/anxious and does not have insomnia.       PAST MEDICAL HISTORY :  Past Medical History:  Diagnosis Date   Anemia     Hemorrhoids during pregnancy    Hives     PAST SURGICAL HISTORY :   Past Surgical History:  Procedure Laterality Date   COLONOSCOPY     COLONOSCOPY WITH PROPOFOL N/A 03/30/2018   Procedure: COLONOSCOPY WITH PROPOFOL;  Surgeon: Scot Jun, MD;  Location: [facility] ENDOSCOPY;  Service: Endoscopy;  Laterality: N/A;   DILATION AND CURETTAGE OF UTERUS      FAMILY HISTORY :   Family History  Problem Relation Age of Onset   Prostate cancer Father    Heart disease Father    Breast cancer Neg Hx     SOCIAL HISTORY:   Social History   Tobacco Use   Smoking status: Never   Smokeless tobacco: Never  Vaping Use   Vaping status: Never Used  Substance Use Topics   Alcohol use: Not Currently    Alcohol/week: 6.0 standard drinks of alcohol    Types: 6 Cans of beer per week   Drug use: Never    ALLERGIES:  has no known allergies.  MEDICATIONS:  Current Outpatient Medications  Medication Sig Dispense Refill   lovastatin (MEVACOR) 40 MG tablet Take 40 mg by mouth at bedtime.     cetirizine (ZYRTEC) 10 MG tablet Take 10 mg by mouth daily. (Patient not taking: Reported on 12/24/2023)     furosemide (LASIX) 20 MG tablet Take 20 mg by mouth daily. (Patient not taking: Reported on 12/24/2023)     mometasone (ELOCON) 0.1 % cream Apply topically to aa body for rash qd/bid prn flares (Patient not taking: Reported on 12/24/2023) 45 g 0   montelukast (SINGULAIR) 10 MG tablet Take 10 mg by mouth daily. (Patient not taking: Reported on 12/24/2023)  No current facility-administered medications for this visit.    PHYSICAL EXAMINATION: ECOG PERFORMANCE STATUS: 2 - Symptomatic, <50% confined to bed  BP 133/79 (BP Location: Left Arm, Patient Position: Sitting, Cuff Size: Normal)   Pulse 73   Temp (!) 96.4 F (35.8 C) (Tympanic)   Ht 5\' 3"  (1.6 m)   Wt 135 lb (61.2 kg)   SpO2 98%   BMI 23.91 kg/m   Filed Weights   12/24/23 0954  Weight: 135 lb (61.2 kg)    Physical Exam Vitals and  nursing note reviewed.  HENT:     Head: Normocephalic and atraumatic.     Mouth/Throat:     Pharynx: Oropharynx is clear.  Eyes:     Extraocular Movements: Extraocular movements intact.     Pupils: Pupils are equal, round, and reactive to light.  Cardiovascular:     Rate and Rhythm: Normal rate and regular rhythm.  Pulmonary:     Comments: Decreased breath sounds bilaterally.  Abdominal:     Palpations: Abdomen is soft.  Musculoskeletal:        General: Normal range of motion.     Cervical back: Normal range of motion.  Skin:    General: Skin is warm.  Neurological:     General: No focal deficit present.     Mental Status: She is alert and oriented to person, place, and time.  Psychiatric:        Behavior: Behavior normal.        Judgment: Judgment normal.      LABORATORY DATA:  I have reviewed the data as listed    Component Value Date/Time   NA 140 12/24/2023 0947   NA 142 03/02/2014 0810   K 4.1 12/24/2023 0947   K 3.9 03/02/2014 0810   CL 107 12/24/2023 0947   CL 109 (H) 03/02/2014 0810   CO2 26 12/24/2023 0947   CO2 22 03/02/2014 0810   GLUCOSE 94 12/24/2023 0947   GLUCOSE 103 (H) 03/02/2014 0810   BUN 14 12/24/2023 0947   BUN 14 03/02/2014 0810   CREATININE 0.63 12/24/2023 0947   CREATININE 0.77 06/24/2023 0853   CREATININE 0.68 03/02/2014 0810   CALCIUM 9.0 12/24/2023 0947   CALCIUM 9.4 03/02/2014 0810   PROT 6.6 12/22/2022 1038   PROT 7.7 03/02/2014 0810   ALBUMIN 4.0 12/22/2022 1038   ALBUMIN 4.0 03/02/2014 0810   AST 19 12/22/2022 1038   AST 27 03/02/2014 0810   ALT 13 12/22/2022 1038   ALT 28 03/02/2014 0810   ALKPHOS 63 12/22/2022 1038   ALKPHOS 77 03/02/2014 0810   BILITOT 1.4 (H) 12/22/2022 1038   BILITOT 1.5 (H) 03/02/2014 0810   GFRNONAA >60 12/24/2023 0947   GFRNONAA >60 06/24/2023 0853   GFRNONAA >60 03/02/2014 0810   GFRAA >60 03/02/2014 0810    No results found for: "SPEP", "UPEP"  Lab Results  Component Value Date   WBC 4.2  12/24/2023   NEUTROABS 2.7 12/24/2023   HGB 14.0 12/24/2023   HCT 41.8 12/24/2023   MCV 93.5 12/24/2023   PLT 146 (L) 12/24/2023      Chemistry      Component Value Date/Time   NA 140 12/24/2023 0947   NA 142 03/02/2014 0810   K 4.1 12/24/2023 0947   K 3.9 03/02/2014 0810   CL 107 12/24/2023 0947   CL 109 (H) 03/02/2014 0810   CO2 26 12/24/2023 0947   CO2 22 03/02/2014 0810   BUN 14 12/24/2023 0947  BUN 14 03/02/2014 0810   CREATININE 0.63 12/24/2023 0947   CREATININE 0.77 06/24/2023 0853   CREATININE 0.68 03/02/2014 0810      Component Value Date/Time   CALCIUM 9.0 12/24/2023 0947   CALCIUM 9.4 03/02/2014 0810   ALKPHOS 63 12/22/2022 1038   ALKPHOS 77 03/02/2014 0810   AST 19 12/22/2022 1038   AST 27 03/02/2014 0810   ALT 13 12/22/2022 1038   ALT 28 03/02/2014 0810   BILITOT 1.4 (H) 12/22/2022 1038   BILITOT 1.5 (H) 03/02/2014 0810       RADIOGRAPHIC STUDIES: I have personally reviewed the radiological images as listed and agreed with the findings in the report. No results found.   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  B12 low at <  50 [JAN 2023; PCP]. Declined injections at home.   # Stressed the importance of monthly B12 injections.  Patient's hemoglobin today is 13.4- Patient will need lifelong B12 injections.   # GI evaluation- 09/2022 Dr.Anna re: pernicous anemi/need for EGD; pt declined EGD.   # DISPOSITION: # B12 injection today # Monthly B12 injection x 6 # follow up in 6 months-MD; labs- cbc/bmp; B12 level- B12 injection---Dr.B    Orders Placed This Encounter  Procedures   CBC with Differential (Cancer Center Only)    Standing Status:   Future    Expected Date:   06/22/2024    Expiration Date:   12/23/2024   Vitamin B12    Standing Status:   Future    Expected Date:   06/22/2024    Expiration Date:   12/23/2024   Basic Metabolic Panel -  Cancer Center Only    Standing Status:   Future    Expected Date:   06/22/2024    Expiration Date:   12/23/2024   All questions were answered. The patient knows to call the clinic with any problems, questions or concerns.      Earna Coder, MD 12/24/2023 10:39 AM

## 2024-01-21 ENCOUNTER — Inpatient Hospital Stay: Payer: PPO | Attending: Internal Medicine

## 2024-01-21 DIAGNOSIS — D510 Vitamin B12 deficiency anemia due to intrinsic factor deficiency: Secondary | ICD-10-CM | POA: Insufficient documentation

## 2024-01-21 DIAGNOSIS — E538 Deficiency of other specified B group vitamins: Secondary | ICD-10-CM

## 2024-01-21 DIAGNOSIS — D649 Anemia, unspecified: Secondary | ICD-10-CM

## 2024-01-21 DIAGNOSIS — Z79899 Other long term (current) drug therapy: Secondary | ICD-10-CM | POA: Diagnosis not present

## 2024-01-21 MED ORDER — CYANOCOBALAMIN 1000 MCG/ML IJ SOLN
1000.0000 ug | Freq: Once | INTRAMUSCULAR | Status: AC
Start: 2024-01-21 — End: 2024-01-21
  Administered 2024-01-21: 1000 ug via INTRAMUSCULAR
  Filled 2024-01-21: qty 1

## 2024-01-25 ENCOUNTER — Other Ambulatory Visit: Payer: Self-pay | Admitting: Family Medicine

## 2024-01-25 DIAGNOSIS — Z1231 Encounter for screening mammogram for malignant neoplasm of breast: Secondary | ICD-10-CM

## 2024-01-27 ENCOUNTER — Telehealth: Payer: Self-pay | Admitting: *Deleted

## 2024-01-27 NOTE — Telephone Encounter (Signed)
 The pt. Is leaving [place] out of state, May she feels like she could be home and then June she is going with grand kid and daughter in Social worker in [place]. She wanted to see if she can get her b12 early for April or she can get the inj. That her daughter in law knows how to do the injections, and if the goes on the trip for June we would need to know the pharamacy  to 2 places.

## 2024-02-08 ENCOUNTER — Encounter: Payer: Self-pay | Admitting: Internal Medicine

## 2024-02-09 ENCOUNTER — Ambulatory Visit (INDEPENDENT_AMBULATORY_CARE_PROVIDER_SITE_OTHER)

## 2024-02-09 ENCOUNTER — Ambulatory Visit: Admitting: Podiatry

## 2024-02-09 ENCOUNTER — Encounter: Payer: Self-pay | Admitting: Podiatry

## 2024-02-09 DIAGNOSIS — M722 Plantar fascial fibromatosis: Secondary | ICD-10-CM

## 2024-02-09 MED ORDER — MELOXICAM 15 MG PO TABS: 15.0000 mg | ORAL_TABLET | Freq: Every day | ORAL | 3 refills | Status: AC

## 2024-02-09 NOTE — Progress Notes (Signed)
  Subjective:  Patient ID: Martha [name], female    DOB: 1945/12/18,  MRN: [id number]  Chief Complaint  Patient presents with   Foot Pain    "My right foot hurts all in here." (Medial heel)    Discussed the use of AI scribe software for clinical note transcription with the patient, who gave verbal consent to proceed.  History of Present Illness Martha [name] is a 78 year old female who presents with right foot pain and inflammation.  She experiences right foot pain and inflammation, particularly in the heel area, which returned on Friday. The pain is significant enough to impact her daily activities and upcoming travel plans, as she is leaving for a road trip on Tuesday and will be gone for about a month.  She had a similar episode six or seven years ago, during which she received two or three cortisone shots that resolved the issue until recently. She has previously taken meloxicam for inflammation, which was effective in the past.  She has been wearing the same pair of Brooks shoes for a couple of years, which may have contributed to the recurrence of her symptoms.      Objective:    Physical Exam VASCULAR: DP and PT pulse palpable. Foot is warm and well-perfused. Capillary fill time is brisk. DERMATOLOGIC: Normal skin turgor, texture, and temperature. No open lesions, rashes, or ulcerations. NEUROLOGIC: Normal sensation to light touch and pressure. No paresthesias. ORTHOPEDIC: Pain at plantar medial heel at insertion of medial band of plantar fascia. Slight gastrocnemius equinus. Smooth pain-free range of motion of all examined joints. No ecchymosis or bruising. No gross deformity.   No images are attached to the encounter.    Results Procedure: Corticosteroid injection Description: The medial right heel was prepped with alcohol, and 20 mg of Kenalog, 4 mg of dexamethasone, and 0.5 cc of 0.5% plain lidocaine were injected into the medial portion of the medial band of the  plantar fascia. Informed Consent: Discussion of the risks, benefits, and alternatives.  RADIOLOGY Right heel radiographs: Small plantar calcaneal spur, mild degenerative changes in midfoot (02/04/2024)   Assessment:   1. Plantar fasciitis of right foot      Plan:  Patient was evaluated and treated and all questions answered.  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 Previous cortisone injections were effective. Current exacerbation likely due to inadequate shoe support. Radiographs reveal a small plantar calcaneal spur, which may exacerbate symptoms but does not require surgical intervention. - Administer corticosteroid injection with 20 mg Kenalog, 4 mg dexamethasone, and 0.5 cc of local anesthetic to the medial band of the plantar fascia. - Prescribe meloxicam for inflammation management, instructing not to take additional NSAIDs but allowing Tylenol if needed. - Provide home therapy instructions for stretching and strengthening exercises to be performed twice daily. - Schedule follow-up appointment in six weeks to assess progress, with the option for her to follow up as needed if symptoms resolve completely.      Return in about 7 weeks (around 03/29/2024) for recheck plantar fasciitis.

## 2024-02-09 NOTE — Patient Instructions (Addendum)
 VISIT SUMMARY:  You came in today because of pain and inflammation in your right foot, particularly in the heel area. This pain has returned after several years and is affecting your daily activities and upcoming travel plans. You have had similar issues in the past, which were resolved with cortisone shots and meloxicam.  YOUR PLAN:  -PLANTAR FASCIITIS: Plantar fasciitis is inflammation of the band of tissue that runs across the bottom of your foot and connects your heel bone to your toes. This can cause heel pain, especially when you walk. Today, you received a corticosteroid injection to help reduce the inflammation and pain. You are also prescribed meloxicam to manage inflammation. Please do not take any additional NSAIDs, but you may take Tylenol if needed. Additionally, you should perform the stretching and strengthening exercises we discussed twice daily.  INSTRUCTIONS:  Please schedule a follow-up appointment in six weeks to assess your progress. If your symptoms resolve completely, you may follow up as needed.   Plantar Fasciitis (Heel Spur Syndrome) with Rehab The plantar fascia is a fibrous, ligament-like, soft-tissue structure that spans the bottom of the foot. Plantar fasciitis is a condition that causes pain in the foot due to inflammation of the tissue. SYMPTOMS  Pain and tenderness on the underneath side of the foot. Pain that worsens with standing or walking. CAUSES  Plantar fasciitis is caused by irritation and injury to the plantar fascia on the underneath side of the foot. Common mechanisms of injury include: Direct trauma to bottom of the foot. Damage to a small nerve that runs under the foot where the main fascia attaches to the heel bone. Stress placed on the plantar fascia due to bone spurs. RISK INCREASES WITH:  Activities that place stress on the plantar fascia (running, jumping, pivoting, or cutting). Poor strength and flexibility. Improperly fitted shoes. Tight  calf muscles. Flat feet. Failure to warm-up properly before activity. Obesity. PREVENTION Warm up and stretch properly before activity. Allow for adequate recovery between workouts. Maintain physical fitness: Strength, flexibility, and endurance. Cardiovascular fitness. Maintain a health body weight. Avoid stress on the plantar fascia. Wear properly fitted shoes, including arch supports for individuals who have flat feet.  PROGNOSIS  If treated properly, then the symptoms of plantar fasciitis usually resolve without surgery. However, occasionally surgery is necessary.  RELATED COMPLICATIONS  Recurrent symptoms that may result in a chronic condition. Problems of the lower back that are caused by compensating for the injury, such as limping. Pain or weakness of the foot during push-off following surgery. Chronic inflammation, scarring, and partial or complete fascia tear, occurring more often from repeated injections.  TREATMENT  Treatment initially involves the use of ice and medication to help reduce pain and inflammation. The use of strengthening and stretching exercises may help reduce pain with activity, especially stretches of the Achilles tendon. These exercises may be performed at home or with a therapist. Your caregiver may recommend that you use heel cups of arch supports to help reduce stress on the plantar fascia. Occasionally, corticosteroid injections are given to reduce inflammation. If symptoms persist for greater than 6 months despite non-surgical (conservative), then surgery may be recommended.   MEDICATION  If pain medication is necessary, then nonsteroidal anti-inflammatory medications, such as aspirin and ibuprofen, or other minor pain relievers, such as acetaminophen, are often recommended. Do not take pain medication within 7 days before surgery. Prescription pain relievers may be given if deemed necessary by your caregiver. Use only as directed and only as  much as  you need. Corticosteroid injections may be given by your caregiver. These injections should be reserved for the most serious cases, because they may only be given a certain number of times.  HEAT AND COLD Cold treatment (icing) relieves pain and reduces inflammation. Cold treatment should be applied for 10 to 15 minutes every 2 to 3 hours for inflammation and pain and immediately after any activity that aggravates your symptoms. Use ice packs or massage the area with a piece of ice (ice massage). Heat treatment may be used prior to performing the stretching and strengthening activities prescribed by your caregiver, physical therapist, or athletic trainer. Use a heat pack or soak the injury in warm water.  SEEK IMMEDIATE MEDICAL CARE IF: Treatment seems to offer no benefit, or the condition worsens. Any medications produce adverse side effects.  EXERCISES- RANGE OF MOTION (ROM) AND STRETCHING EXERCISES - Plantar Fasciitis (Heel Spur Syndrome) These exercises may help you when beginning to rehabilitate your injury. Your symptoms may resolve with or without further involvement from your physician, physical therapist or athletic trainer. While completing these exercises, remember:  Restoring tissue flexibility helps normal motion to return to the joints. This allows healthier, less painful movement and activity. An effective stretch should be held for at least 30 seconds. A stretch should never be painful. You should only feel a gentle lengthening or release in the stretched tissue.  RANGE OF MOTION - Toe Extension, Flexion Sit with your right / left leg crossed over your opposite knee. Grasp your toes and gently pull them back toward the top of your foot. You should feel a stretch on the bottom of your toes and/or foot. Hold this stretch for 10 seconds. Now, gently pull your toes toward the bottom of your foot. You should feel a stretch on the top of your toes and or foot. Hold this stretch for  10 seconds. Repeat  times. Complete this stretch 3 times per day.   RANGE OF MOTION - Ankle Dorsiflexion, Active Assisted Remove shoes and sit on a chair that is preferably not on a carpeted surface. Place right / left foot under knee. Extend your opposite leg for support. Keeping your heel down, slide your right / left foot back toward the chair until you feel a stretch at your ankle or calf. If you do not feel a stretch, slide your bottom forward to the edge of the chair, while still keeping your heel down. Hold this stretch for 10 seconds. Repeat 3 times. Complete this stretch 2 times per day.   STRETCH  Gastroc, Standing Place hands on wall. Extend right / left leg, keeping the front knee somewhat bent. Slightly point your toes inward on your back foot. Keeping your right / left heel on the floor and your knee straight, shift your weight toward the wall, not allowing your back to arch. You should feel a gentle stretch in the right / left calf. Hold this position for 10 seconds. Repeat 3 times. Complete this stretch 2 times per day.  STRETCH  Soleus, Standing Place hands on wall. Extend right / left leg, keeping the other knee somewhat bent. Slightly point your toes inward on your back foot. Keep your right / left heel on the floor, bend your back knee, and slightly shift your weight over the back leg so that you feel a gentle stretch deep in your back calf. Hold this position for 10 seconds. Repeat 3 times. Complete this stretch 2 times per day.  STRETCH  Gastrocsoleus, Standing  Note: This exercise can place a lot of stress on your foot and ankle. Please complete this exercise only if specifically instructed by your caregiver.  Place the ball of your right / left foot on a step, keeping your other foot firmly on the same step. Hold on to the wall or a rail for balance. Slowly lift your other foot, allowing your body weight to press your heel down over the edge of the step. You  should feel a stretch in your right / left calf. Hold this position for 10 seconds. Repeat this exercise with a slight bend in your right / left knee. Repeat 3 times. Complete this stretch 2 times per day.   STRENGTHENING EXERCISES - Plantar Fasciitis (Heel Spur Syndrome)  These exercises may help you when beginning to rehabilitate your injury. They may resolve your symptoms with or without further involvement from your physician, physical therapist or athletic trainer. While completing these exercises, remember:  Muscles can gain both the endurance and the strength needed for everyday activities through controlled exercises. Complete these exercises as instructed by your physician, physical therapist or athletic trainer. Progress the resistance and repetitions only as guided.  STRENGTH - Towel Curls Sit in a chair positioned on a non-carpeted surface. Place your foot on a towel, keeping your heel on the floor. Pull the towel toward your heel by only curling your toes. Keep your heel on the floor. Repeat 3 times. Complete this exercise 2 times per day.  STRENGTH - Ankle Inversion Secure one end of a rubber exercise band/tubing to a fixed object (table, pole). Loop the other end around your foot just before your toes. Place your fists between your knees. This will focus your strengthening at your ankle. Slowly, pull your big toe up and in, making sure the band/tubing is positioned to resist the entire motion. Hold this position for 10 seconds. Have your muscles resist the band/tubing as it slowly pulls your foot back to the starting position. Repeat 3 times. Complete this exercises 2 times per day.  Document Released: 10/19/2005 Document Revised: 01/11/2012 Document Reviewed: 01/31/2009 [facility] Patient Information 2014 [place], [place].

## 2024-02-14 ENCOUNTER — Inpatient Hospital Stay: Attending: Internal Medicine

## 2024-02-14 DIAGNOSIS — E538 Deficiency of other specified B group vitamins: Secondary | ICD-10-CM

## 2024-02-14 DIAGNOSIS — D510 Vitamin B12 deficiency anemia due to intrinsic factor deficiency: Secondary | ICD-10-CM | POA: Diagnosis not present

## 2024-02-14 DIAGNOSIS — D649 Anemia, unspecified: Secondary | ICD-10-CM

## 2024-02-14 MED ORDER — CYANOCOBALAMIN 1000 MCG/ML IJ SOLN
1000.0000 ug | Freq: Once | INTRAMUSCULAR | Status: AC
  Administered 2024-02-14: 1000 ug via INTRAMUSCULAR
  Filled 2024-02-14: qty 1

## 2024-02-21 ENCOUNTER — Inpatient Hospital Stay: Payer: PPO

## 2024-02-29 ENCOUNTER — Encounter

## 2024-03-22 ENCOUNTER — Inpatient Hospital Stay: Payer: PPO | Attending: Internal Medicine

## 2024-03-22 DIAGNOSIS — D510 Vitamin B12 deficiency anemia due to intrinsic factor deficiency: Secondary | ICD-10-CM | POA: Insufficient documentation

## 2024-03-22 DIAGNOSIS — D649 Anemia, unspecified: Secondary | ICD-10-CM

## 2024-03-22 DIAGNOSIS — E538 Deficiency of other specified B group vitamins: Secondary | ICD-10-CM

## 2024-03-22 MED ORDER — CYANOCOBALAMIN 1000 MCG/ML IJ SOLN
1000.0000 ug | Freq: Once | INTRAMUSCULAR | Status: AC
  Administered 2024-03-22: 1000 ug via INTRAMUSCULAR
  Filled 2024-03-22: qty 1

## 2024-03-23 ENCOUNTER — Ambulatory Visit
Admission: RE | Admit: 2024-03-23 | Discharge: 2024-03-23 | Disposition: A | Discharge status: Home / Self Care | Source: Ambulatory Visit | Attending: Family Medicine | Admitting: Family Medicine

## 2024-03-23 DIAGNOSIS — Z1231 Encounter for screening mammogram for malignant neoplasm of breast: Secondary | ICD-10-CM | POA: Diagnosis not present

## 2024-03-29 ENCOUNTER — Ambulatory Visit: Admitting: Podiatry

## 2024-04-21 ENCOUNTER — Inpatient Hospital Stay: Payer: PPO

## 2024-04-28 ENCOUNTER — Inpatient Hospital Stay: Attending: Internal Medicine

## 2024-04-28 DIAGNOSIS — D510 Vitamin B12 deficiency anemia due to intrinsic factor deficiency: Secondary | ICD-10-CM | POA: Diagnosis not present

## 2024-04-28 DIAGNOSIS — D649 Anemia, unspecified: Secondary | ICD-10-CM

## 2024-04-28 DIAGNOSIS — Z79899 Other long term (current) drug therapy: Secondary | ICD-10-CM | POA: Insufficient documentation

## 2024-04-28 DIAGNOSIS — E538 Deficiency of other specified B group vitamins: Secondary | ICD-10-CM

## 2024-04-28 MED ORDER — CYANOCOBALAMIN 1000 MCG/ML IJ SOLN
1000.0000 ug | Freq: Once | INTRAMUSCULAR | Status: AC
  Administered 2024-04-28: 1000 ug via INTRAMUSCULAR
  Filled 2024-04-28: qty 1

## 2024-05-22 ENCOUNTER — Inpatient Hospital Stay: Payer: PPO

## 2024-05-22 ENCOUNTER — Telehealth: Payer: Self-pay | Admitting: *Deleted

## 2024-05-22 NOTE — Telephone Encounter (Signed)
 Patient needs a new B12 injection appointment she thought it was tomorrow but it was today.  Can you please give her a call and tell her when she can come back the next time for B12 inj.

## 2024-05-23 ENCOUNTER — Inpatient Hospital Stay: Attending: Internal Medicine

## 2024-05-23 DIAGNOSIS — E538 Deficiency of other specified B group vitamins: Secondary | ICD-10-CM

## 2024-05-23 DIAGNOSIS — D649 Anemia, unspecified: Secondary | ICD-10-CM

## 2024-05-23 DIAGNOSIS — D510 Vitamin B12 deficiency anemia due to intrinsic factor deficiency: Secondary | ICD-10-CM | POA: Diagnosis not present

## 2024-05-23 MED ORDER — CYANOCOBALAMIN 1000 MCG/ML IJ SOLN
1000.0000 ug | Freq: Once | INTRAMUSCULAR | Status: AC
  Administered 2024-05-23: 1000 ug via INTRAMUSCULAR
  Filled 2024-05-23: qty 1

## 2024-06-22 ENCOUNTER — Inpatient Hospital Stay (HOSPITAL_BASED_OUTPATIENT_CLINIC_OR_DEPARTMENT_OTHER): Payer: PPO | Admitting: Internal Medicine

## 2024-06-22 ENCOUNTER — Encounter: Payer: Self-pay | Admitting: Internal Medicine

## 2024-06-22 ENCOUNTER — Other Ambulatory Visit: Payer: PPO | Attending: Internal Medicine

## 2024-06-22 ENCOUNTER — Inpatient Hospital Stay: Payer: PPO

## 2024-06-22 VITALS — BP 127/74 | HR 61 | Temp 98.3°F | Resp 18 | Ht 63.0 in | Wt 141.4 lb

## 2024-06-22 DIAGNOSIS — E538 Deficiency of other specified B group vitamins: Secondary | ICD-10-CM | POA: Diagnosis not present

## 2024-06-22 DIAGNOSIS — Z79899 Other long term (current) drug therapy: Secondary | ICD-10-CM | POA: Insufficient documentation

## 2024-06-22 DIAGNOSIS — D510 Vitamin B12 deficiency anemia due to intrinsic factor deficiency: Secondary | ICD-10-CM | POA: Diagnosis not present

## 2024-06-22 DIAGNOSIS — D649 Anemia, unspecified: Secondary | ICD-10-CM

## 2024-06-22 LAB — BASIC METABOLIC PANEL - CANCER CENTER ONLY
Anion gap: 7 (ref 5–15)
BUN: 18 mg/dL (ref 8–23)
CO2: 24 mmol/L (ref 22–32)
Calcium: 9.1 mg/dL (ref 8.9–10.3)
Chloride: 107 mmol/L (ref 98–111)
Creatinine: 0.79 mg/dL (ref 0.44–1.00)
GFR, Estimated: >60 mL/min (ref >60)
Glucose, Bld: 94 mg/dL (ref 70–99)
Potassium: 4.1 mmol/L (ref 3.5–5.1)
Sodium: 138 mmol/L (ref 135–145)

## 2024-06-22 LAB — CBC WITH DIFFERENTIAL (CANCER CENTER ONLY)
Abs Immature Granulocytes: 0.01 K/uL (ref 0.00–0.07)
Basophils Absolute: 0 K/uL (ref 0.0–0.1)
Basophils Relative: 0 %
Eosinophils Absolute: 0.2 K/uL (ref 0.0–0.5)
Eosinophils Relative: 3 %
HCT: 39.5 % (ref 36.0–46.0)
Hemoglobin: 13.1 g/dL (ref 12.0–15.0)
Immature Granulocytes: 0 %
Lymphocytes Relative: 26 %
Lymphs Abs: 1.2 K/uL (ref 0.7–4.0)
MCH: 31.5 pg (ref 26.0–34.0)
MCHC: 33.2 g/dL (ref 30.0–36.0)
MCV: 95 fL (ref 80.0–100.0)
Monocytes Absolute: 0.4 K/uL (ref 0.1–1.0)
Monocytes Relative: 9 %
Neutro Abs: 2.9 K/uL (ref 1.7–7.7)
Neutrophils Relative %: 62 %
Platelet Count: 152 K/uL (ref 150–400)
RBC: 4.16 MIL/uL (ref 3.87–5.11)
RDW: 12.5 % (ref 11.5–15.5)
WBC Count: 4.7 K/uL (ref 4.0–10.5)
nRBC: 0 % (ref 0.0–0.2)

## 2024-06-22 LAB — VITAMIN B12: Vitamin B-12: 196 pg/mL (ref 180–914)

## 2024-06-22 MED ORDER — CYANOCOBALAMIN 1000 MCG/ML IJ SOLN
1000.0000 ug | Freq: Once | INTRAMUSCULAR | Status: AC
  Administered 2024-06-22: 1000 ug via INTRAMUSCULAR
  Filled 2024-06-22: qty 1

## 2024-06-22 NOTE — Assessment & Plan Note (Signed)
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  B12 low at <  50 [JAN 2023; PCP]. Declined injections at home.   # Stressed the importance of monthly B12 injections.  Patient's hemoglobin today is 13.4- Patient will need lifelong B12 injections.   # GI evaluation- 09/2022 Dr.Anna re: pernicous anemi/need for EGD; pt declined EGD.   # DISPOSITION: # B12 injection today # Monthly B12 injection x 12 # follow up in 12 months-MD; labs- cbc/bmp; B12 level- B12 injection---Dr.B

## 2024-06-22 NOTE — Progress Notes (Signed)
 [facility] OFFICE PROGRESS NOTE  Patient Care Team: Valora Agent, MD as PCP - General (Family Medicine) Rennie Cindy SAUNDERS, MD as Consulting Physician (Oncology)   Cancer Staging  No matching staging information was found for the patient.   Oncology History   No history exists.   #PERNICIOUS anemia-severe B12 deficiency/moderate anemia hemoglobin 9; WBC 2.3; platelets 123-MCV 120.  B12 low at <  50 [JAN 2023; PCP].  Positive for antiparietal antibodies/intrinsic factor antibody; elevated methylmalonic acid; high LDH/low haptoglobin.  B12 injections-  # GI evaluation [Dr.Anna]March 2023   HISTORY OF PRESENT ILLNESS: Ambulating independently.  Alone.   Martha [name] 78 y.o.  female pleasant pernicious anemia/severe B12 deficiency is here for follow-up.   Pt does say she feels sad several days a week, lost her spouse in January 2025. Doesn't want referral to therapy. She talks to her son and daughter in law about how she feels.  Otherwise notes to have significant improvement of energy levels.  Denies any worsening shortness of breath or cough.  Review of Systems  Constitutional:  Negative for chills, diaphoresis and fever.  HENT:  Negative for nosebleeds and sore throat.   Eyes:  Negative for double vision.  Respiratory:  Negative for cough, hemoptysis, sputum production and wheezing.   Cardiovascular:  Negative for chest pain, palpitations, orthopnea and leg swelling.  Gastrointestinal:  Negative for abdominal pain, blood in stool, constipation, diarrhea, heartburn, melena, nausea and vomiting.  Genitourinary:  Negative for dysuria, frequency and urgency.  Musculoskeletal:  Negative for back pain and joint pain.  Skin: Negative.  Negative for itching and rash.  Neurological:  Negative for tingling, focal weakness, weakness and headaches.  Endo/Heme/Allergies:  Does not bruise/bleed easily.  Psychiatric/Behavioral:  Negative for depression. The patient is not  nervous/anxious and does not have insomnia.       PAST MEDICAL HISTORY :  Past Medical History:  Diagnosis Date   Anemia    Hemorrhoids during pregnancy    Hives     PAST SURGICAL HISTORY :   Past Surgical History:  Procedure Laterality Date   COLONOSCOPY     COLONOSCOPY WITH PROPOFOL  N/A 03/30/2018   Procedure: COLONOSCOPY WITH PROPOFOL ;  Surgeon: Viktoria Lamar DASEN, MD;  Location: [facility] ENDOSCOPY;  Service: Endoscopy;  Laterality: N/A;   DILATION AND CURETTAGE OF UTERUS      FAMILY HISTORY :   Family History  Problem Relation Age of Onset   Prostate cancer Father    Heart disease Father    Breast cancer Neg Hx     SOCIAL HISTORY:   Social History   Tobacco Use   Smoking status: Never   Smokeless tobacco: Never  Vaping Use   Vaping status: Never Used  Substance Use Topics   Alcohol use: Yes    Alcohol/week: 6.0 standard drinks of alcohol    Types: 6 Cans of beer per week    Comment: occasionally   Drug use: Never    ALLERGIES:  has no known allergies.  MEDICATIONS:  Current Outpatient Medications  Medication Sig Dispense Refill   lovastatin (MEVACOR) 40 MG tablet Take 40 mg by mouth at bedtime.     cetirizine (ZYRTEC) 10 MG tablet Take 10 mg by mouth daily. (Patient not taking: Reported on 06/22/2024)     furosemide (LASIX) 20 MG tablet Take 20 mg by mouth daily. (Patient not taking: Reported on 06/22/2024)     meloxicam  (MOBIC ) 15 MG tablet Take 1 tablet (15 mg total)  by mouth daily. (Patient not taking: Reported on 06/22/2024) 30 tablet 3   mometasone  (ELOCON ) 0.1 % cream Apply topically to aa body for rash qd/bid prn flares (Patient not taking: Reported on 06/22/2024) 45 g 0   montelukast (SINGULAIR) 10 MG tablet Take 10 mg by mouth daily. (Patient not taking: Reported on 06/22/2024)     No current facility-administered medications for this visit.   Facility-Administered Medications Ordered in Other Visits  Medication Dose Route Frequency Provider Last Rate  Last Admin   cyanocobalamin  (VITAMIN B12) injection 1,000 mcg  1,000 mcg Intramuscular Once Sheresa Cullop R, MD        PHYSICAL EXAMINATION: ECOG PERFORMANCE STATUS: 2 - Symptomatic, <50% confined to bed  BP 127/74 (BP Location: Left Arm, Patient Position: Sitting, Cuff Size: Normal)   Pulse 61   Temp 98.3 F (36.8 C) (Tympanic)   Resp 18   Ht 5' 3 (1.6 m)   Wt 141 lb 6.4 oz (64.1 kg)   SpO2 100%   BMI 25.05 kg/m   Filed Weights   06/22/24 0958  Weight: 141 lb 6.4 oz (64.1 kg)    Physical Exam Vitals and nursing note reviewed.  HENT:     Head: Normocephalic and atraumatic.     Mouth/Throat:     Pharynx: Oropharynx is clear.  Eyes:     Extraocular Movements: Extraocular movements intact.     Pupils: Pupils are equal, round, and reactive to light.  Cardiovascular:     Rate and Rhythm: Normal rate and regular rhythm.  Pulmonary:     Comments: Decreased breath sounds bilaterally.  Abdominal:     Palpations: Abdomen is soft.  Musculoskeletal:        General: Normal range of motion.     Cervical back: Normal range of motion.  Skin:    General: Skin is warm.  Neurological:     General: No focal deficit present.     Mental Status: She is alert and oriented to person, place, and time.  Psychiatric:        Behavior: Behavior normal.        Judgment: Judgment normal.      LABORATORY DATA:  I have reviewed the data as listed    Component Value Date/Time   NA 138 06/22/2024 0950   NA 142 03/02/2014 0810   K 4.1 06/22/2024 0950   K 3.9 03/02/2014 0810   CL 107 06/22/2024 0950   CL 109 (H) 03/02/2014 0810   CO2 24 06/22/2024 0950   CO2 22 03/02/2014 0810   GLUCOSE 94 06/22/2024 0950   GLUCOSE 103 (H) 03/02/2014 0810   BUN 18 06/22/2024 0950   BUN 14 03/02/2014 0810   CREATININE 0.79 06/22/2024 0950   CREATININE 0.68 03/02/2014 0810   CALCIUM 9.1 06/22/2024 0950   CALCIUM 9.4 03/02/2014 0810   PROT 6.6 12/22/2022 1038   PROT 7.7 03/02/2014 0810    ALBUMIN 4.0 12/22/2022 1038   ALBUMIN 4.0 03/02/2014 0810   AST 19 12/22/2022 1038   AST 27 03/02/2014 0810   ALT 13 12/22/2022 1038   ALT 28 03/02/2014 0810   ALKPHOS 63 12/22/2022 1038   ALKPHOS 77 03/02/2014 0810   BILITOT 1.4 (H) 12/22/2022 1038   BILITOT 1.5 (H) 03/02/2014 0810   GFRNONAA >60 06/22/2024 0950   GFRNONAA >60 03/02/2014 0810   GFRAA >60 03/02/2014 0810    No results found for: SPEP, UPEP  Lab Results  Component Value Date   WBC 4.7 06/22/2024  NEUTROABS 2.9 06/22/2024   HGB 13.1 06/22/2024   HCT 39.5 06/22/2024   MCV 95.0 06/22/2024   PLT 152 06/22/2024      Chemistry      Component Value Date/Time   NA 138 06/22/2024 0950   NA 142 03/02/2014 0810   K 4.1 06/22/2024 0950   K 3.9 03/02/2014 0810   CL 107 06/22/2024 0950   CL 109 (H) 03/02/2014 0810   CO2 24 06/22/2024 0950   CO2 22 03/02/2014 0810   BUN 18 06/22/2024 0950   BUN 14 03/02/2014 0810   CREATININE 0.79 06/22/2024 0950   CREATININE 0.68 03/02/2014 0810      Component Value Date/Time   CALCIUM 9.1 06/22/2024 0950   CALCIUM 9.4 03/02/2014 0810   ALKPHOS 63 12/22/2022 1038   ALKPHOS 77 03/02/2014 0810   AST 19 12/22/2022 1038   AST 27 03/02/2014 0810   ALT 13 12/22/2022 1038   ALT 28 03/02/2014 0810   BILITOT 1.4 (H) 12/22/2022 1038   BILITOT 1.5 (H) 03/02/2014 0810       RADIOGRAPHIC STUDIES: I have personally reviewed the radiological images as listed and agreed with the findings in the report. No results found.   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  B12 low at <  50 [JAN 2023; PCP]. Declined injections at home.   # Stressed the importance of monthly B12 injections.  Patient's hemoglobin today is 13.4- Patient will need lifelong B12 injections.   # GI evaluation- 09/2022 Dr.Anna re: pernicous anemi/need for EGD; pt declined EGD.   # DISPOSITION: # B12  injection today # Monthly B12 injection x 12 # follow up in 12 months-MD; labs- cbc/bmp; B12 level- B12 injection---Dr.B    Orders Placed This Encounter  Procedures   CBC with Differential (Cancer Center Only)    Standing Status:   Future    Expected Date:   06/22/2025    Expiration Date:   06/24/2025   Basic metabolic panel with GFR    Standing Status:   Future    Expected Date:   06/22/2025    Expiration Date:   06/24/2025   Vitamin B12    Standing Status:   Future    Expected Date:   06/22/2025    Expiration Date:   06/24/2025   All questions were answered. The patient knows to call the clinic with any problems, questions or concerns.      Cindy JONELLE Joe, MD 06/22/2024 10:26 AM

## 2024-06-22 NOTE — Progress Notes (Signed)
 Fatigue/weakness: NO Dyspena: NO Light headedness: NO  Pt does say she feels sad several days a week, lost her spouse in January 2025. Doesn't want referral to therapy. She talks to her son and daughter in law about how she feels.

## 2024-07-24 ENCOUNTER — Inpatient Hospital Stay: Attending: Internal Medicine

## 2024-07-24 DIAGNOSIS — Z79899 Other long term (current) drug therapy: Secondary | ICD-10-CM | POA: Insufficient documentation

## 2024-07-24 DIAGNOSIS — E538 Deficiency of other specified B group vitamins: Secondary | ICD-10-CM

## 2024-07-24 DIAGNOSIS — D510 Vitamin B12 deficiency anemia due to intrinsic factor deficiency: Secondary | ICD-10-CM | POA: Insufficient documentation

## 2024-07-24 DIAGNOSIS — D649 Anemia, unspecified: Secondary | ICD-10-CM

## 2024-07-24 MED ORDER — CYANOCOBALAMIN 1000 MCG/ML IJ SOLN
1000.0000 ug | Freq: Once | INTRAMUSCULAR | Status: AC
  Administered 2024-07-24: 1000 ug via INTRAMUSCULAR
  Filled 2024-07-24: qty 1

## 2024-08-04 DIAGNOSIS — Z23 Encounter for immunization: Secondary | ICD-10-CM | POA: Diagnosis not present

## 2024-08-15 DIAGNOSIS — M1711 Unilateral primary osteoarthritis, right knee: Secondary | ICD-10-CM | POA: Diagnosis not present

## 2024-08-15 DIAGNOSIS — G8929 Other chronic pain: Secondary | ICD-10-CM | POA: Diagnosis not present

## 2024-08-18 ENCOUNTER — Inpatient Hospital Stay: Attending: Internal Medicine

## 2024-08-18 DIAGNOSIS — D510 Vitamin B12 deficiency anemia due to intrinsic factor deficiency: Secondary | ICD-10-CM | POA: Diagnosis not present

## 2024-08-18 DIAGNOSIS — E538 Deficiency of other specified B group vitamins: Secondary | ICD-10-CM

## 2024-08-18 DIAGNOSIS — D649 Anemia, unspecified: Secondary | ICD-10-CM

## 2024-08-18 MED ORDER — CYANOCOBALAMIN 1000 MCG/ML IJ SOLN
1000.0000 ug | Freq: Once | INTRAMUSCULAR | Status: AC
  Administered 2024-08-18: 1000 ug via INTRAMUSCULAR
  Filled 2024-08-18: qty 1

## 2024-08-23 ENCOUNTER — Inpatient Hospital Stay

## 2024-09-01 ENCOUNTER — Encounter: Payer: Self-pay | Admitting: Internal Medicine

## 2024-09-04 DIAGNOSIS — M1711 Unilateral primary osteoarthritis, right knee: Secondary | ICD-10-CM | POA: Diagnosis not present

## 2024-09-04 DIAGNOSIS — G8929 Other chronic pain: Secondary | ICD-10-CM | POA: Diagnosis not present

## 2024-09-04 DIAGNOSIS — M7051 Other bursitis of knee, right knee: Secondary | ICD-10-CM | POA: Diagnosis not present

## 2024-09-05 DIAGNOSIS — E785 Hyperlipidemia, unspecified: Secondary | ICD-10-CM | POA: Diagnosis not present

## 2024-09-05 DIAGNOSIS — Z0000 Encounter for general adult medical examination without abnormal findings: Secondary | ICD-10-CM | POA: Diagnosis not present

## 2024-09-12 DIAGNOSIS — G8929 Other chronic pain: Secondary | ICD-10-CM | POA: Diagnosis not present

## 2024-09-12 DIAGNOSIS — D510 Vitamin B12 deficiency anemia due to intrinsic factor deficiency: Secondary | ICD-10-CM | POA: Diagnosis not present

## 2024-09-12 DIAGNOSIS — I83811 Varicose veins of right lower extremities with pain: Secondary | ICD-10-CM | POA: Diagnosis not present

## 2024-09-12 DIAGNOSIS — E785 Hyperlipidemia, unspecified: Secondary | ICD-10-CM | POA: Diagnosis not present

## 2024-09-12 DIAGNOSIS — Z1331 Encounter for screening for depression: Secondary | ICD-10-CM | POA: Diagnosis not present

## 2024-09-12 DIAGNOSIS — Z0000 Encounter for general adult medical examination without abnormal findings: Secondary | ICD-10-CM | POA: Diagnosis not present

## 2024-09-12 DIAGNOSIS — E538 Deficiency of other specified B group vitamins: Secondary | ICD-10-CM | POA: Diagnosis not present

## 2024-09-12 DIAGNOSIS — F4320 Adjustment disorder, unspecified: Secondary | ICD-10-CM | POA: Diagnosis not present

## 2024-09-12 DIAGNOSIS — M25561 Pain in right knee: Secondary | ICD-10-CM | POA: Diagnosis not present

## 2024-09-22 ENCOUNTER — Inpatient Hospital Stay: Attending: Internal Medicine

## 2024-09-22 DIAGNOSIS — E538 Deficiency of other specified B group vitamins: Secondary | ICD-10-CM

## 2024-09-22 DIAGNOSIS — D510 Vitamin B12 deficiency anemia due to intrinsic factor deficiency: Secondary | ICD-10-CM | POA: Insufficient documentation

## 2024-09-22 DIAGNOSIS — D649 Anemia, unspecified: Secondary | ICD-10-CM

## 2024-09-22 MED ORDER — CYANOCOBALAMIN 1000 MCG/ML IJ SOLN
1000.0000 ug | Freq: Once | INTRAMUSCULAR | Status: AC
  Administered 2024-09-22: 1000 ug via INTRAMUSCULAR
  Filled 2024-09-22: qty 1

## 2024-10-12 ENCOUNTER — Other Ambulatory Visit: Payer: Self-pay | Admitting: Sports Medicine

## 2024-10-12 DIAGNOSIS — M1711 Unilateral primary osteoarthritis, right knee: Secondary | ICD-10-CM

## 2024-10-12 DIAGNOSIS — G8929 Other chronic pain: Secondary | ICD-10-CM

## 2024-10-12 DIAGNOSIS — M7051 Other bursitis of knee, right knee: Secondary | ICD-10-CM

## 2024-10-15 ENCOUNTER — Ambulatory Visit
Admission: RE | Admit: 2024-10-15 | Discharge: 2024-10-15 | Disposition: A | Source: Ambulatory Visit | Attending: Sports Medicine

## 2024-10-15 DIAGNOSIS — M7051 Other bursitis of knee, right knee: Secondary | ICD-10-CM

## 2024-10-15 DIAGNOSIS — M1711 Unilateral primary osteoarthritis, right knee: Secondary | ICD-10-CM

## 2024-10-15 DIAGNOSIS — G8929 Other chronic pain: Secondary | ICD-10-CM

## 2024-10-23 ENCOUNTER — Inpatient Hospital Stay: Attending: Internal Medicine

## 2024-10-23 DIAGNOSIS — D649 Anemia, unspecified: Secondary | ICD-10-CM

## 2024-10-23 DIAGNOSIS — D510 Vitamin B12 deficiency anemia due to intrinsic factor deficiency: Secondary | ICD-10-CM | POA: Insufficient documentation

## 2024-10-23 DIAGNOSIS — E538 Deficiency of other specified B group vitamins: Secondary | ICD-10-CM

## 2024-10-23 MED ORDER — CYANOCOBALAMIN 1000 MCG/ML IJ SOLN
1000.0000 ug | Freq: Once | INTRAMUSCULAR | Status: AC
  Administered 2024-10-23: 1000 ug via INTRAMUSCULAR
  Filled 2024-10-23: qty 1

## 2024-11-23 ENCOUNTER — Inpatient Hospital Stay: Attending: Internal Medicine

## 2024-11-23 DIAGNOSIS — E538 Deficiency of other specified B group vitamins: Secondary | ICD-10-CM

## 2024-11-23 DIAGNOSIS — D510 Vitamin B12 deficiency anemia due to intrinsic factor deficiency: Secondary | ICD-10-CM | POA: Diagnosis present

## 2024-11-23 DIAGNOSIS — D649 Anemia, unspecified: Secondary | ICD-10-CM

## 2024-11-23 MED ORDER — CYANOCOBALAMIN 1000 MCG/ML IJ SOLN
1000.0000 ug | Freq: Once | INTRAMUSCULAR | Status: AC
  Administered 2024-11-23: 1000 ug via INTRAMUSCULAR
  Filled 2024-11-23: qty 1

## 2024-12-01 ENCOUNTER — Other Ambulatory Visit: Payer: Self-pay | Admitting: Podiatry

## 2024-12-25 ENCOUNTER — Inpatient Hospital Stay: Attending: Internal Medicine

## 2025-01-22 ENCOUNTER — Inpatient Hospital Stay: Attending: Internal Medicine

## 2025-02-22 ENCOUNTER — Inpatient Hospital Stay: Attending: Internal Medicine

## 2025-03-23 ENCOUNTER — Inpatient Hospital Stay: Attending: Internal Medicine

## 2025-04-23 ENCOUNTER — Inpatient Hospital Stay: Attending: Internal Medicine

## 2025-05-23 ENCOUNTER — Inpatient Hospital Stay: Attending: Internal Medicine

## 2025-06-22 ENCOUNTER — Ambulatory Visit

## 2025-06-22 ENCOUNTER — Other Ambulatory Visit

## 2025-06-22 ENCOUNTER — Ambulatory Visit: Admitting: Internal Medicine
